# Patient Record
Sex: Female | Born: 1965 | Race: Black or African American | Hispanic: No | Marital: Married | Smoking: Never smoker
Health system: Southern US, Community
[De-identification: ages and names within clinical notes are randomized; demographics above are authoritative.]

## PROBLEM LIST (undated history)

## (undated) DIAGNOSIS — D649 Anemia, unspecified: Secondary | ICD-10-CM

## (undated) DIAGNOSIS — T7840XA Allergy, unspecified, initial encounter: Secondary | ICD-10-CM

## (undated) HISTORY — PX: TUBAL LIGATION: SHX77

## (undated) HISTORY — DX: Anemia, unspecified: D64.9

## (undated) HISTORY — DX: Allergy, unspecified, initial encounter: T78.40XA

## (undated) HISTORY — PX: ABDOMINAL HYSTERECTOMY: SHX81

---

## 2005-06-08 ENCOUNTER — Emergency Department (HOSPITAL_COMMUNITY): Admission: EM | Admit: 2005-06-08 | Discharge: 2005-06-09 | Payer: Self-pay | Admitting: Family Medicine

## 2006-11-08 ENCOUNTER — Emergency Department (HOSPITAL_COMMUNITY): Admission: EM | Admit: 2006-11-08 | Discharge: 2006-11-08 | Payer: Self-pay | Admitting: Family Medicine

## 2006-11-14 ENCOUNTER — Other Ambulatory Visit: Admission: RE | Admit: 2006-11-14 | Discharge: 2006-11-14 | Payer: Self-pay | Admitting: Family Medicine

## 2007-03-08 IMAGING — CT CT PELVIS W/ CM
3 of 5 series · 14 of 32 positions shown, 19 images · IV contrast (ORAL OMNI 350 & 100 ML OMNI 300)
Comparison: None.
COMPARISON: None.

CLINICAL DATA: Abdominal pain and right lower back pain.  
 ABDOMEN CT WITH CONTRAST ? 06/09/05:
TECHNIQUE: Multidetector CT imaging of the abdomen was performed following the standard protocol during bolus administration of intravenous contrast. 
 Contrast: 100 cc Omnipaque 300 IV.
TECHNIQUE: Multidetector CT imaging of the pelvis was performed following the standard protocol during bolus administration of intravenous contrast.

[Series 2: routine abdomen · axial · 0.70mm/px · z∈[-323,-108]mm · 4 of 73 slices shown, 9 images]
[im 15/73  soft-tissue]
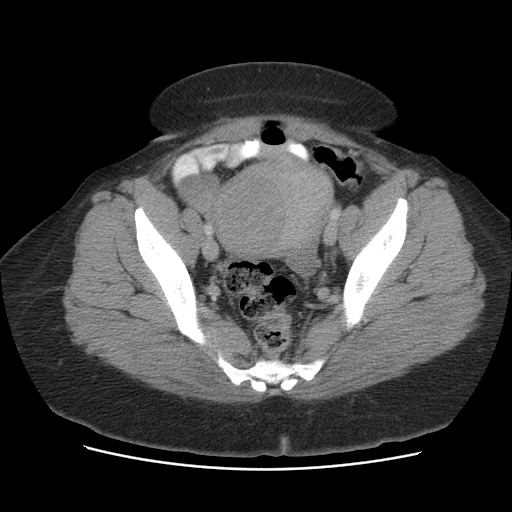
[im 15/73  lung]
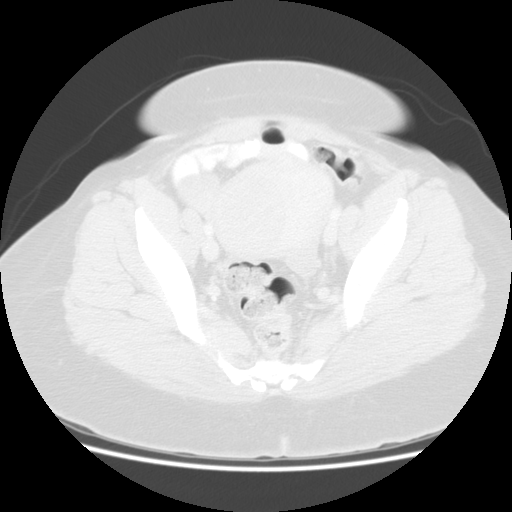
[im 15/73  bone]
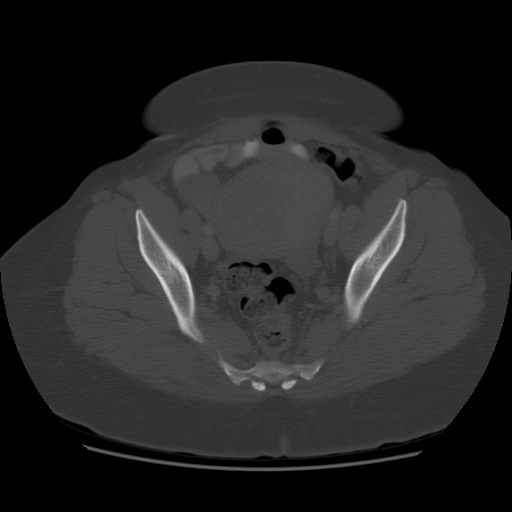
[im 29/73  soft-tissue]
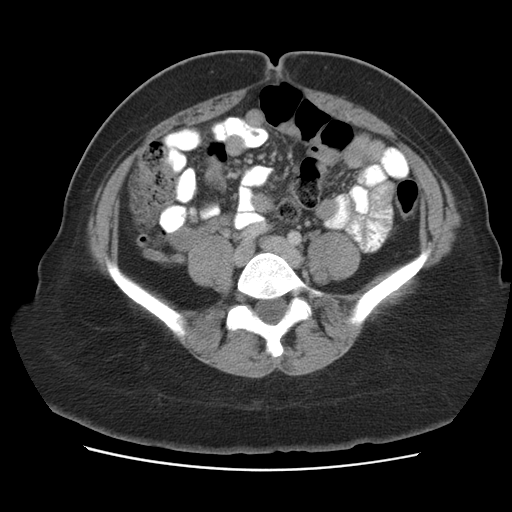
[im 29/73  lung]
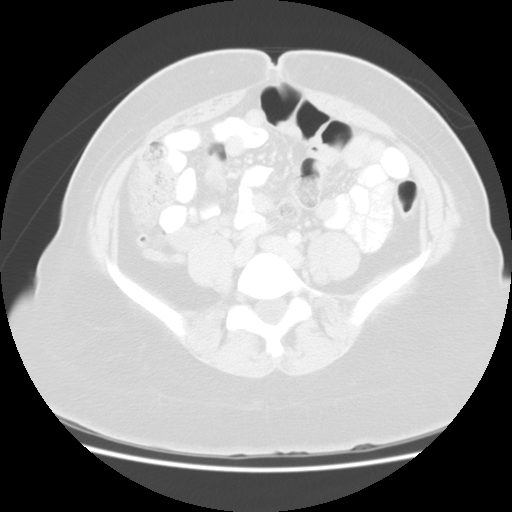
[im 44/73  soft-tissue]
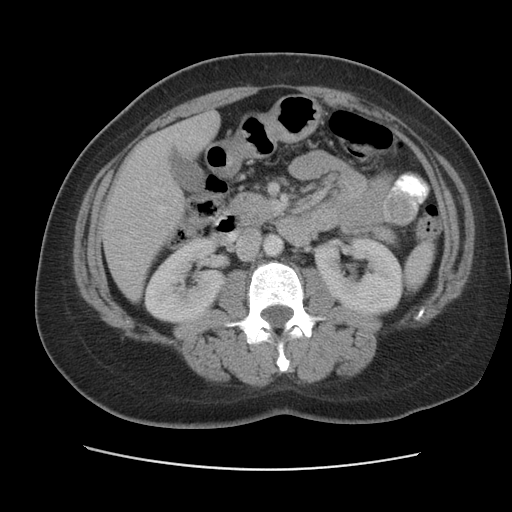
[im 44/73  lung]
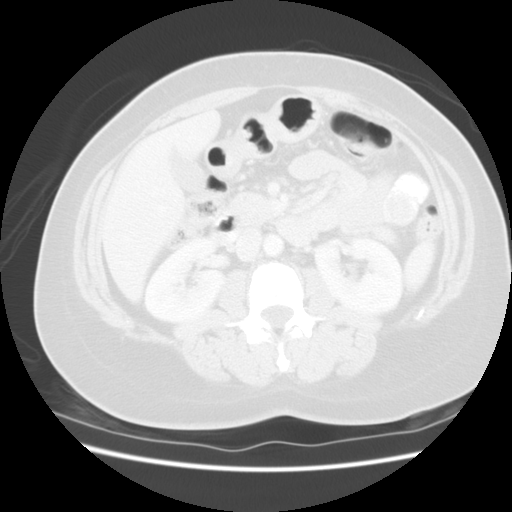
[im 58/73  soft-tissue]
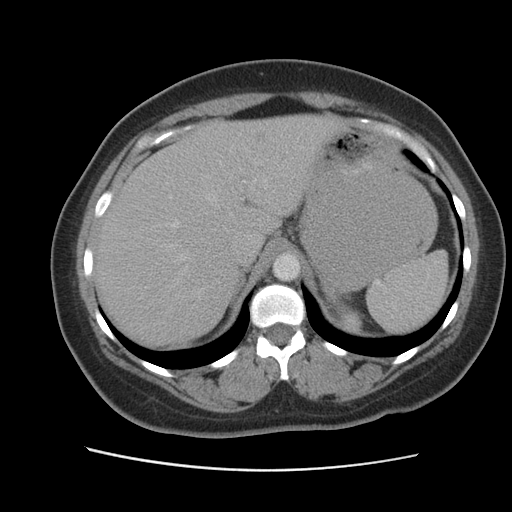
[im 58/73  lung]
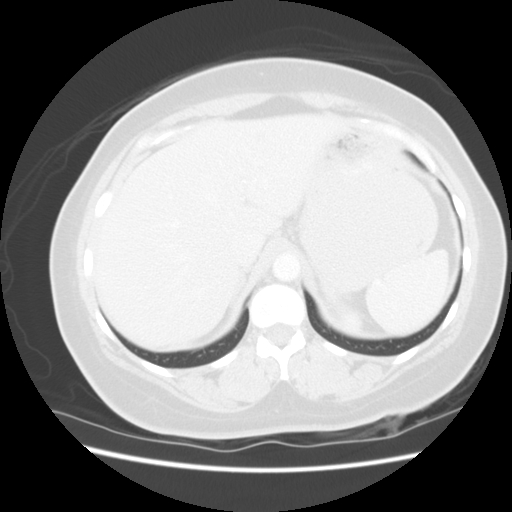

[Series 103: reformatted · sagittal · 0.82mm/px · 8 of 148 slices shown (1 of 2)]
[im 15/148  soft-tissue]
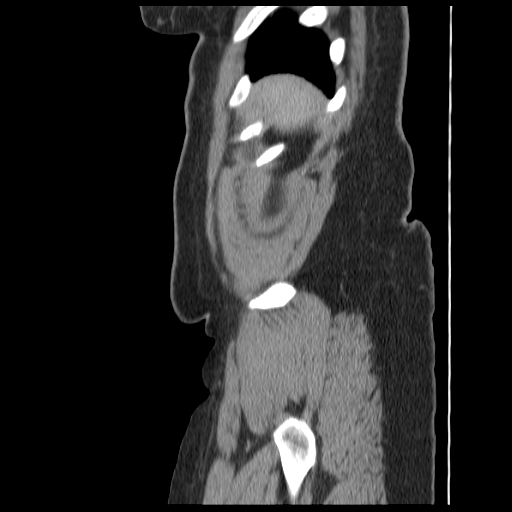
[im 30/148  soft-tissue]
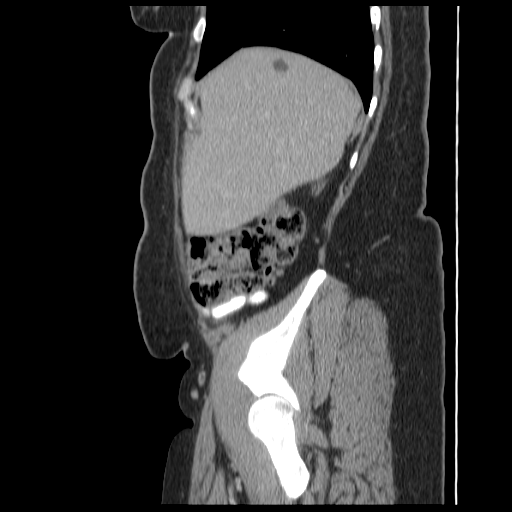
[im 45/148  soft-tissue]
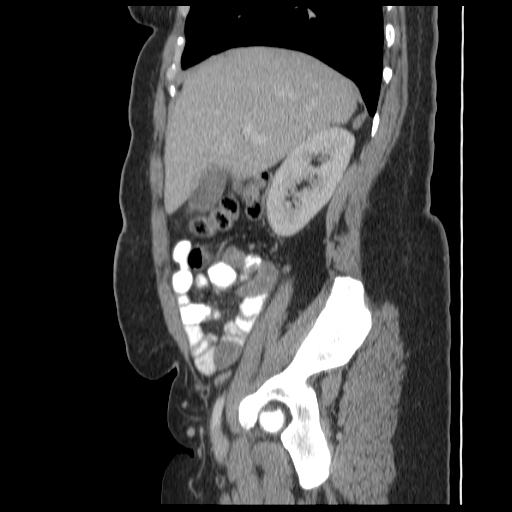
[im 59/148  soft-tissue]
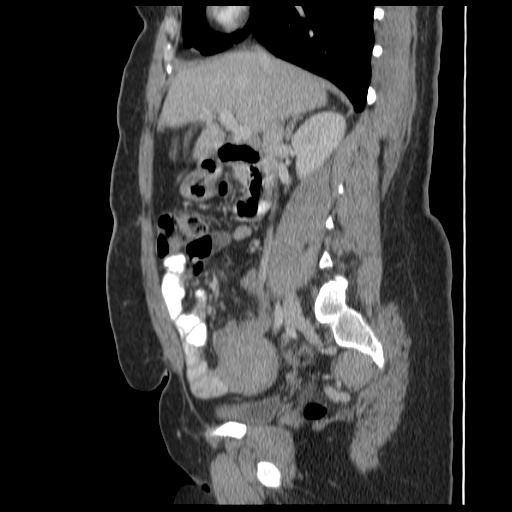
[im 89/148  soft-tissue]
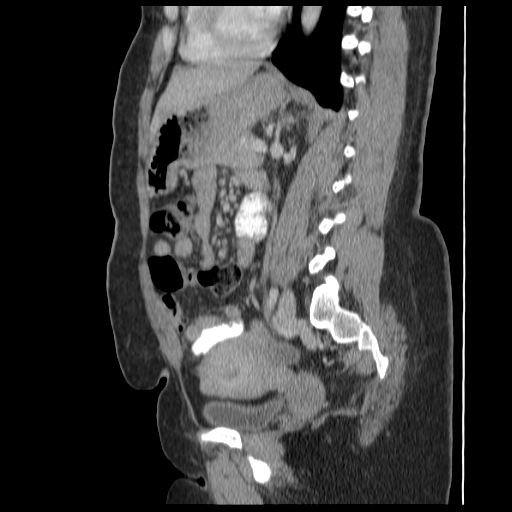
[im 103/148  soft-tissue]
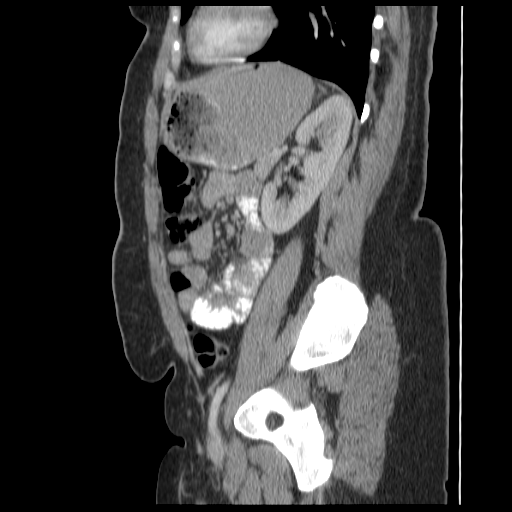
[im 118/148  soft-tissue]
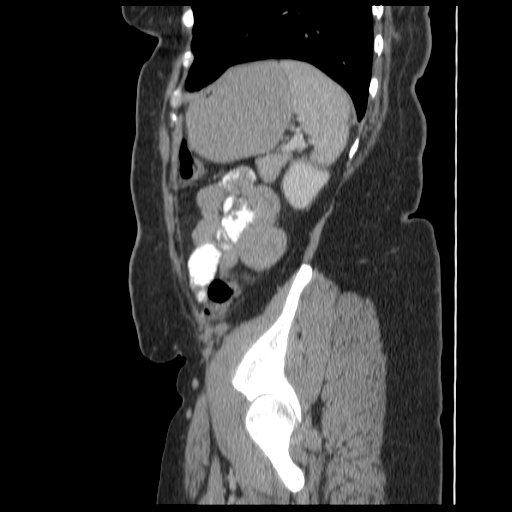
[im 133/148  soft-tissue]
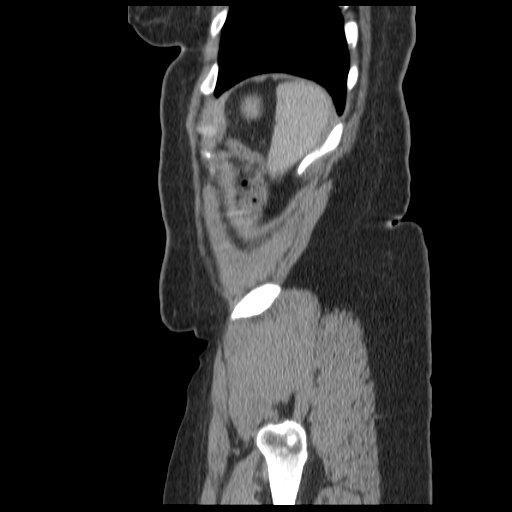

[Series 104: reformatted · coronal · 0.82mm/px · 2 of 128 slices shown (2 of 2)]
[im 15/128  soft-tissue]
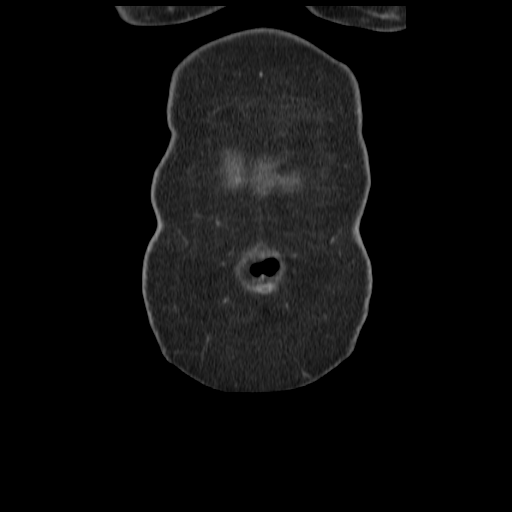
[im 29/128  soft-tissue]
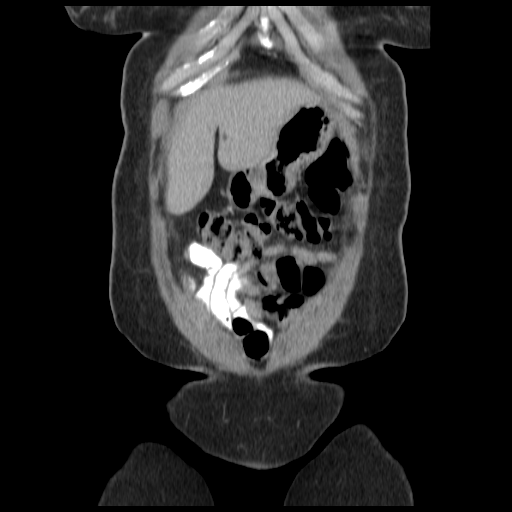

[14 of 32 positions shown; findings below may reference images not displayed]

FINDINGS: The lung bases are clear.  No pleural or pericardial effusion.  A low-attenuation lesion is seen in the dome of the liver compatible with a cyst.  A few too small to characterize low-attenuation lesions are seen in the right and left lobes of the liver also likely representing cysts.  The spleen, adrenal glands, kidneys, and pancreas all appear normal.  The stomach and small bowel have an unremarkable CT appearance.  There is no abdominal lymphadenopathy or fluid collection.  No focal bony abnormality.
IMPRESSION: No acute finding in the abdomen with hepatic cysts noted.
 PELVIS CT WITH CONTRAST ? 06/09/05:
FINDINGS: The appendix is well visualized and normal.  The patient has a large submucosal fibroid in the right aspect of the uterus measuring approximately 5.6 cm AP x 5.3 cm transverse by 5.2 cm craniocaudal.  The colon is unremarkable.  There is no free pelvic fluid or lymphadenopathy.  There is no focal bony abnormality.
IMPRESSION: 1.  Negative for appendicitis or other acute abnormality in the pelvis.
 2.  Large submucosal fibroid in the right side of the uterus.

## 2007-07-09 ENCOUNTER — Emergency Department (HOSPITAL_COMMUNITY): Admission: EM | Admit: 2007-07-09 | Discharge: 2007-07-09 | Payer: Self-pay | Admitting: Family Medicine

## 2007-11-23 ENCOUNTER — Other Ambulatory Visit: Admission: RE | Admit: 2007-11-23 | Discharge: 2007-11-23 | Payer: Self-pay | Admitting: Family Medicine

## 2007-11-26 ENCOUNTER — Encounter: Admission: RE | Admit: 2007-11-26 | Discharge: 2007-11-26 | Payer: Self-pay | Admitting: Family Medicine

## 2008-08-20 ENCOUNTER — Inpatient Hospital Stay (HOSPITAL_COMMUNITY): Admission: RE | Admit: 2008-08-20 | Discharge: 2008-08-22 | Payer: Self-pay | Admitting: Obstetrics and Gynecology

## 2008-08-20 ENCOUNTER — Encounter (INDEPENDENT_AMBULATORY_CARE_PROVIDER_SITE_OTHER): Payer: Self-pay | Admitting: Obstetrics and Gynecology

## 2008-10-29 ENCOUNTER — Emergency Department (HOSPITAL_COMMUNITY): Admission: EM | Admit: 2008-10-29 | Discharge: 2008-10-29 | Payer: Self-pay | Admitting: Emergency Medicine

## 2009-01-15 ENCOUNTER — Emergency Department (HOSPITAL_COMMUNITY): Admission: EM | Admit: 2009-01-15 | Discharge: 2009-01-15 | Payer: Self-pay | Admitting: Emergency Medicine

## 2010-05-30 LAB — CBC
HCT: 22.3 % — ABNORMAL LOW (ref 36.0–46.0)
MCV: 88.3 fL (ref 78.0–100.0)
RBC: 2.52 MIL/uL — ABNORMAL LOW (ref 3.87–5.11)
WBC: 13.1 10*3/uL — ABNORMAL HIGH (ref 4.0–10.5)

## 2010-05-31 LAB — CBC
Hemoglobin: 9.7 g/dL — ABNORMAL LOW (ref 12.0–15.0)
MCHC: 34 g/dL (ref 30.0–36.0)
MCV: 87.2 fL (ref 78.0–100.0)
RBC: 3.27 MIL/uL — ABNORMAL LOW (ref 3.87–5.11)
WBC: 6.7 10*3/uL (ref 4.0–10.5)

## 2010-05-31 LAB — PREGNANCY, URINE: Preg Test, Ur: NEGATIVE

## 2010-07-06 NOTE — H&P (Signed)
NAME:  Joan Campbell, Joan Campbell                ACCOUNT NO.:  [id number]   MEDICAL RECORD NO.:  [id number]          PATIENT TYPE:  AMB   LOCATION:  SDC                           [facility]ILITY:  [facility]   PHYSICIAN:  Hal Morales, M.D.DATE OF BIRTH:  10/19/1965   DATE OF ADMISSION:  DATE OF DISCHARGE:                              HISTORY & PHYSICAL   HISTORY OF PRESENT ILLNESS:  Joan Campbell is a 45 year old married African  American female, para 2-0-0-2, presenting for hysterectomy because of  symptomatic uterine fibroids, menorrhagia, pelvic pain, and anemia.  For  the past year, the patient reports that she has had an 8-day menstrual  flow, the first 2 days requiring her to change a tampon plus a maxi pad  every 30 minutes.  On occasion, the patient has blood to her clothes and  at other times she may go as long as 2 hours before she has to change  her tampon with the maxi pad.  Additionally, she will experience  cramping which she rates as the 10/10 on a 10-point pain scale and has  some relief (decreasing to 5/10 on 10-point pain scale) with ibuprofen  400 mg.  She denies any intermenstrual bleeding, dyspareunia, changes in  her bowel movements, urinary tract symptoms, or vaginitis symptoms.  A  pelvic ultrasound in October 2009 showed her uterus measuring 9.3 cm in  length and 9.1 cm in width.  She was also found to have multiple  fibroids measuring 5.6 x 6.0 x 6.9 cm, 4.3 x 3.7 x 3.6 cm, and at least  5 other fibroids with maximum diameter of 4.2 cm.  On this study, it was  described as being difficult to visualize endometrium because of the  fibroids.  Ovaries were normal size on this study.  The patient  underwent an endometrial biopsy which revealed no hyperplasia, atypia or  malignancy and her CSH was within normal limits.  The patient had a  hemoglobin done in April 2010, which returned 9.7 and subsequently the  patient was placed on twice a day iron therapy.  A review of options for  management were given to the patient to include Lupron oral  contraceptive, Mirena IUD, Depo-Provera, myomectomy, and hysterectomy.  However, the patient has decided to proceed with definitive therapy in  the form of hysterectomy with some consideration being given to a  supracervical hysterectomy.   PAST MEDICAL HISTORY:   OB HISTORY:  Gravida 2, para 2-0-0-2.  The patient had 2 cesarean  sections, 1 in 1989 and 1 in 1996, both yielding female children.   GYN HISTORY:  Menarche at 44 years old.  Last menstrual period on Jul 20, 2008.  The patient has had tubal ligation as a method of  contraception.  Denies any history of sexually transmitted diseases or  abnormal Pap smears.  Last normal Pap smear was October 2009.   MEDICAL HISTORY:  Positive for anemia, gastroesophageal reflux disease,  premenstrual anxiety, and a heart murmur.   SURGICAL HISTORY:  In 1996, bilateral tubal ligation.  Denies any  problems with anesthesia or history of blood  transfusions.   FAMILY HISTORY:  Cardiovascular disease, anemia, non-insulin-dependent  diabetes, and schizophrenia.   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   HABITS:  The patient rarely consumes alcohol.  She denies any tobacco or  illicit drug use.   CURRENT MEDICATIONS:  1. Ibuprofen 400 mg as needed.  2. Iron 1 tablet daily.   ALLERGIES:  The patient has no known drug allergies and also denies any  sensitivity to latex or shellfish.   REVIEW OF SYSTEMS:  The patient does wear glasses.  She admits to  occasional knee pain and occasional skin itchiness.  She denies any  chest pain, shortness of breath, headache, vision changes, ringing in  her ears, dizziness, muscle pain, nausea, vomiting, diarrhea, and except  as mentioned in the history of present illness.  The patient's review of  systems is otherwise negative.   PHYSICAL EXAMINATION:  VITAL SIGNS:  Blood  pressure 110/70 pulses 88,  temperature 99 degrees Fahrenheit orally, weight is 168, height 5 feet 5  inches tall.  Body mass index 28.  NECK:  Supple without masses.  There is no thyromegaly or cervical  adenopathy.  HEART:  Regular rate and rhythm.  LUNGS:  Clear.  BACK:  No CVA tenderness.  ABDOMEN:  No tenderness though there is a firm mass arising from the  pelvis to the level of the umbilicus.  There is no organomegaly.  EXTREMITIES:  No clubbing, cyanosis, or edema.  PELVIC:  EGBUS is within normal limits.  Vagina is normal.  Cervix is  nontender without lesions.  Uterus is 18-20 weeks size without  tenderness.  Adnexa without tenderness or masses.  Rectovaginal without  tenderness or masses.   IMPRESSION:  Symptomatic uterine fibroids, menorrhagia, anemia, and  pelvic pain.   DISPOSITION:  A discussion was held with the patient regarding the  indications for her procedure along with its risks which include but are  not limited to, reaction to anesthesia, damage to adjacent organs,  infection and excessive bleeding.  It was further reviewed with the  patient that should she desire to proceed with a supracervical  hysterectomy that her risks   may expand to include prolapse of that organ, monthly bleeding, cervical  cancer, and it was also shared with the patient that there is no clear  data indicating that sexual function is improved with the retention of  the cervix with the hysterectomy procedure.  The patient verbalized  understanding of these risk and wishes to proceed with supracervical  hysterectomy at [facility] [place] on August 20, 2008, at 11  o'clock a.m.  Marland Kitchen      Elmira J. Adline Peals.      Hal Morales, M.D.  Electronically Signed    EJP/MEDQ  D:  08/14/2008  T:  08/15/2008  Job:  [id number]

## 2010-07-06 NOTE — Op Note (Signed)
NAME:  Joan Campbell, Joan Campbell                ACCOUNT NO.:  [id number]   MEDICAL RECORD NO.:  [id number]          PATIENT TYPE:  INP   LOCATION:  9319                          FACILITY:  [facility]   PHYSICIAN:  Hal Morales, M.D.DATE OF BIRTH:  [birth year]   DATE OF PROCEDURE:  08/20/2008  DATE OF DISCHARGE:                               OPERATIVE REPORT   PREOPERATIVE DIAGNOSES:  Symptomatic uterine fibroids, menorrhagia,  anemia, and pelvic pain and desire for preservation of the cervix.   POSTOPERATIVE DIAGNOSES:  Symptomatic uterine fibroids, menorrhagia,  anemia, and pelvic pain and desire for preservation of the cervix.   OPERATION:  Abdominal supracervical hysterectomy and bilateral  salpingectomy.   SURGEON:  Vanessa P. Pennie Rushing, MD   FIRST ASSISTANT:  Elmira J. Lowell Guitar, PA   ANESTHESIA:  General orotracheal.   ESTIMATED BLOOD LOSS:  250 mL.   COMPLICATIONS:  None.   FINDINGS:  The uterus was approximately 14- to 16-week size   Dictation ended at this point.      Hal Morales, M.D.  Electronically Signed     VPH/MEDQ  D:  08/20/2008  T:  08/21/2008  Job:  [id number]

## 2010-07-06 NOTE — Op Note (Signed)
NAME:  Joan Campbell, Joan Campbell                ACCOUNT NO.:  [id number]   MEDICAL RECORD NO.:  [id number]          PATIENT TYPE:  INP   LOCATION:  9319                          [facility]   PHYSICIAN:  Hal Morales, M.D.DATE OF BIRTH:  September 14, 1965   DATE OF PROCEDURE:  08/20/2008  DATE OF DISCHARGE:                               OPERATIVE REPORT   PREOPERATIVE DIAGNOSES:  Symptomatic uterine fibroids, menorrhagia,  anemia, pelvic pain, desire for cervical preservation.   POSTOPERATIVE DIAGNOSES:  Symptomatic uterine fibroids, menorrhagia,  anemia, pelvic pain, desire for cervical preservation   OPERATION:  Abdominal supracervical hysterectomy and bilateral  salpingectomy.   SURGEON:  Hal Morales, MD   FIRST ASSISTANT:  Elmira J. Lowell Guitar, certified Advice worker.   ANESTHESIA:  General orotracheal.   ESTIMATED BLOOD LOSS:  250 mL.   COMPLICATIONS:  None.   FINDINGS:  The uterus was enlarged to 16- to 18-week size.  The tubes  were status post tubal interruption for sterilization.  The ovaries were  within normal limits.  There were significant adhesions to the anterior  uterine fundal fibroid from the peritoneum and from the bladder flap  status post cesarean section.   PROCEDURE IN DETAIL:  The patient was taken to the operating room after  appropriate identification and placed on the operating table.  After the  attainment of adequate general anesthesia with the patient in the supine  position, the abdomen, perineum and vagina were prepped with multiple  layers of Betadine and a Foley catheter was inserted into the bladder  and connected to straight drainage.  The abdomen was draped as a sterile  field.  The suprapubic region was infiltrated with 20 mL of 0.25%  Marcaine at the site of the previous cesarean section incision.  An  incision was made in this area and the abdomen opened in layers.  The  peritoneum was entered and the above-noted adhesions encountered.   A  combination of blunt and sharp dissection allowed lysis of adhesions  from the anterior abdominal wall to the uterus.  The uterus was grasped  at each cornual region with a Kelly clamp and elevated into the  operative field.  Additional lysis of adhesions allowed for the bladder  flap to be released from the anterior uterine fibroid and further  elevation was possible.  The left round ligament was then suture ligated  and incised and then incision taken anteriorly on the anterior leaf of  the broad ligament down to the site of the bladder flap on the left  side.  The utero-ovarian ligament and tubal ovarian junction were  clamped, cut, suture ligated and tied with a free tie.  A similar  procedure was carried out on the right side with the round ligament and  upper pedicle.  Further lysis of adhesions between the bladder and  uterine fundus was undertaken allowing the plane, as the bladder to the  dissected off the anterior uterus down to the level of the cervix.  The  uterine artery on the left side was then skeletonized, clamped, cut and  suture ligated.  A similar procedure was carried out with the uterine  artery on the right side.  The uterine fundus was then excised from the  cervix and removed from the operative field.  Copious irrigation was  carried out and hemostasis noted to be adequate.  The endocervical canal  was slightly cord and then cauterized to minimize the chance of  postoperative endometrial withdrawal bleeding.  The peritoneum was then  oversewn over the cervical stump.  The fallopian tube on the right side  was then identified and clamped, cut, and suture ligated with a suture  of 2-0 Vicryl with the remaining sutures of this procedure being 0  Vicryl.  The distal portion of tube was then excised from the ovary and  removed from the operative field.  A similar procedure was carried out  on the distal portion of tube on the left.  Copious irrigation was  carried  out and hemostasis noted to be adequate.  The abdominal  peritoneum was then closed with a running suture of 2-0 Vicryl.  The  rectus muscles were irrigated and made hemostatic with Bovie cautery.  The rectus fascia was closed with a running suture of 0 Vicryl from each  apex to the midline.  The subcutaneous tissue was irrigated and made  hemostatic with Bovie cautery.  A subcuticular suture of 3-0 Monocryl  was placed in the skin incision and a sterile dressing applied after  Steri-Strips have been applied.  The patient was awakened from general  anesthesia and taken to the recovery room in satisfactory condition  having tolerated the procedure well with sponge and instrument counts  correct.   SPECIMENS TO PATHOLOGY:  Uterine fundus and portions of right and left  fallopian tube.      Hal Morales, M.D.  Electronically Signed     VPH/MEDQ  D:  08/20/2008  T:  08/21/2008  Job:  [id number]

## 2010-07-09 NOTE — Discharge Summary (Signed)
NAME:  Joan Campbell, Joan Campbell                ACCOUNT NO.:  [id number]   MEDICAL RECORD NO.:  [id number]          PATIENT TYPE:  INP   LOCATION:  9319                          [facility]   PHYSICIAN:  Hal Morales, M.D.DATE OF BIRTH:  04-11-1965   DATE OF ADMISSION:  08/20/2008  DATE OF DISCHARGE:  08/22/2008                               DISCHARGE SUMMARY   DISCHARGE DIAGNOSES:  Symptomatic uterine fibroids, menorrhagia, anemia,  pelvic pain, and desire for preservation of cervix.   OPERATION:  On the date of admission, the patient underwent an abdominal  supracervical hysterectomy with bilateral salpingectomy, tolerating  procedure well.   HISTORY OF PRESENT ILLNESS:  Joan Campbell is a 45 year old married African  American female, para 2-0-0-2, presenting for hysterectomy because of  symptomatic uterine fibroids, menorrhagia, pelvic pain, and anemia.  Please see the patient's dictated history and physical examination for  details.   PREOPERATIVE PHYSICAL EXAMINATION:  VITAL SIGNS:  Blood pressure 110/70,  pulse 88, temperature 99 degrees Fahrenheit orally, weight was 160  pounds, height 5 feet 5 inches tall, body mass index was 28.  GENERAL:  Within normal limits.  PELVIC:  EG/BUS was within normal limits.  Vagina was normal.  Cervix  was nontender without lesions.  Uterus was 18-20 weeks' size without  tenderness.  Adnexa was without tenderness or masses.  Rectovaginal was  without tenderness or masses.   HOSPITAL COURSE:  On the date of admission, the patient underwent  aforementioned procedures, tolerating them all well.  Postoperative  course was unremarkable with the patient tolerating a postop hemoglobin  of 7.5 (preop hemoglobin 9.7).  By postop day #2, the patient had  resumed bowel and bladder function and was therefore deemed ready for  discharge home.   DISCHARGE MEDICATIONS:  The patient was directed to her home medication  reconciliation form.  She was further  advised to take Colace 100 mg  twice daily until her bowel movements are regular, ibuprofen 600 mg with  food every 6 hours for 5 days then as needed for pain, Vicodin 1-2  tablets every 4 hours as needed for pain, and was advised to increase  her iron to twice daily for 6 weeks.   FOLLOWUP:  The patient has a followup appointment with Dr. Pennie Rushing on  September 29, 2008, at 10:15 a.m.   DISCHARGE INSTRUCTIONS:  The patient was given a copy of [facility] postoperative instruction sheet.  She was further  advised to avoid driving for 2 weeks, heavy lifting for 4 weeks,  intercourse for 6 weeks, that she may shower and she may walk up steps.  The patient's diet was without restriction.   FINAL PATHOLOGY:  Uterus, fallopian tubes, supracervical hysterectomy,  and bilateral salpingectomy:  Endometrium - endometrial polyp;  proliferative-type endometrium; myometrium - leiomyomata, adenomyosis;  serosa - adhesions and fallopian tubes - essentially unremarkable.      Elmira J. Adline Peals.      Hal Morales, M.D.  Electronically Signed    EJP/MEDQ  D:  09/09/2008  T:  09/09/2008  Job:  [id number]

## 2014-07-01 ENCOUNTER — Ambulatory Visit (INDEPENDENT_AMBULATORY_CARE_PROVIDER_SITE_OTHER): Payer: 59 | Admitting: Physician Assistant

## 2014-07-01 VITALS — BP 110/66 | HR 87 | Temp 98.1°F | Resp 16 | Ht 65.0 in | Wt 200.6 lb

## 2014-07-01 DIAGNOSIS — W57XXXA Bitten or stung by nonvenomous insect and other nonvenomous arthropods, initial encounter: Secondary | ICD-10-CM | POA: Diagnosis not present

## 2014-07-01 DIAGNOSIS — R0981 Nasal congestion: Secondary | ICD-10-CM

## 2014-07-01 DIAGNOSIS — J302 Other seasonal allergic rhinitis: Secondary | ICD-10-CM

## 2014-07-01 DIAGNOSIS — S90562A Insect bite (nonvenomous), left ankle, initial encounter: Secondary | ICD-10-CM | POA: Diagnosis not present

## 2014-07-01 MED ORDER — LORATADINE 10 MG PO TABS: 10.0000 mg | ORAL_TABLET | Freq: Every day | ORAL | Status: DC

## 2014-07-01 MED ORDER — IPRATROPIUM BROMIDE 0.03 % NA SOLN: 2.0000 | Freq: Two times a day (BID) | NASAL | Status: DC

## 2014-07-01 MED ORDER — GUAIFENESIN ER 1200 MG PO TB12
1.0000 | ORAL_TABLET | Freq: Two times a day (BID) | ORAL | Status: DC | PRN
Start: 2014-07-01 — End: 2020-06-18

## 2014-07-01 MED ORDER — MUPIROCIN 2 % EX OINT: 1.0000 "application " | TOPICAL_OINTMENT | Freq: Two times a day (BID) | CUTANEOUS | Status: DC

## 2014-07-01 NOTE — Progress Notes (Signed)
[facility] [address], [place] [id number] [phone]  Date:  07/01/2014   Name:  Joan Campbell   DOB:  03-Nov-1965   MRN:  [id number]  PCP:  No primary care provider on file.    Chief Complaint: Nasal Congestion and Mass   History of Present Illness:  Joan Lundster M Decoursey is a 49 y.o. very pleasant female patient who presents with the following:  Mass started Saturday.  She was walking on wet grass.  She pushed it off, felt stinging.  Little pustule to start, and started today.  No fever .  About 99.  No numbness or tingling.  Does not hurt to ambulate.  Hurts to touch.  She has done nothing. Husband applied alcohol to the bite on Sunday.     Sunday with nasal congestion, sore throat that has resolved yesterday.  Sneezing and coughing non-productive.  No trouble with breathing.  No ear fullness or pain.  She has watery eyes.  She has allergies dxd with seasonal but no distinction.  She took zyrtec which helped some.    There are no active problems to display for this patient.   Past Medical History  Diagnosis Date  . Allergy   . Anemia     Past Surgical History  Procedure Laterality Date  . Cesarean section    . Abdominal hysterectomy    . Tubal ligation      History  Substance Use Topics  . Smoking status: Not on file  . Smokeless tobacco: Not on file  . Alcohol Use: Not on file    Family History  Problem Relation Age of Onset  . Cancer Mother   . Diabetes Mother   . Mental illness Sister   . Heart disease Maternal Grandmother     No Known Allergies  Medication list has been reviewed and updated.  No current outpatient prescriptions on file prior to visit.   No current facility-administered medications on file prior to visit.    Review of Systems: ROS otherwise unremarkable unless listed above.    Physical Examination: Filed Vitals:   07/01/14 1050  BP: 110/66  Pulse: 87  Temp: 98.1 F (36.7 C)  Resp: 16   Filed Vitals:    07/01/14 1050  Height: 5\' 5"  (1.651 m)  Weight: 200 lb 9.6 oz (90.992 kg)   Body mass index is 33.38 kg/(m^2). Ideal Body Weight: Weight in (lb) to have BMI = 25: 149.9  Physical Exam  Constitutional: She is oriented to person, place, and time. She appears well-developed and well-nourished. No distress.  HENT:  Head: Normocephalic and atraumatic.  Right Ear: Tympanic membrane, external ear and ear canal normal.  Left Ear: Tympanic membrane, external ear and ear canal normal.  Nose: Mucosal edema (Pale mucosa) and rhinorrhea present. Right sinus exhibits no maxillary sinus tenderness and no frontal sinus tenderness. Left sinus exhibits no maxillary sinus tenderness and no frontal sinus tenderness.  Mouth/Throat: Oropharynx is clear and moist. No oropharyngeal exudate.  Eyes: EOM are normal. Pupils are equal, round, and reactive to light. Right eye exhibits no discharge. Left eye exhibits no discharge.  Cardiovascular: Normal rate, regular rhythm and normal heart sounds.  Exam reveals no gallop and no friction rub.   No murmur heard. Pulmonary/Chest: Effort normal. No apnea. No respiratory distress. She has no decreased breath sounds. She has no wheezes. She has no rales.  Neurological: She is alert and oriented to person, place, and time. No cranial  nerve deficit. Coordination normal.  Skin: Skin is warm and dry. She is not diaphoretic.  Left medial malleolus with localized erythematous pustule.  Mildly tender, but normal ROM and strength at all planes.  Normal dp pulse  Psychiatric: She has a normal mood and affect. Her behavior is normal.     Assessment and Plan: 49 year old female is here today for chief complaint of nasal congestion and mass.  This appears to be allergic rhinitis.  I have advised that she start a second generation antihistamine, ipratropium nasal spray, and mucinex to thin mucus.  Insect bite, minorly infected.  Advised to apply mupirocin to the localized site,  though the body should be able to fight off infection.  Seasonal allergies - Plan: Guaifenesin (MUCINEX MAXIMUM STRENGTH) 1200 MG TB12, ipratropium (ATROVENT) 0.03 % nasal spray, loratadine (CLARITIN) 10 MG tablet  Nasal congestion - Plan: Guaifenesin (MUCINEX MAXIMUM STRENGTH) 1200 MG TB12, ipratropium (ATROVENT) 0.03 % nasal spray, loratadine (CLARITIN) 10 MG tablet  Insect bite of ankle, left, initial encounter - Plan: mupirocin ointment (BACTROBAN) 2 %  Trena PlattStephanie Kermit Arnette, PA-C [facility] 5/12/20169:31 AM

## 2014-07-01 NOTE — Patient Instructions (Addendum)
Please continue to hydrate well. Take the medication as prescribed. You may also want to try sudafed as a decongestant.

## 2014-07-03 ENCOUNTER — Encounter: Payer: Self-pay | Admitting: Physician Assistant

## 2019-04-28 ENCOUNTER — Ambulatory Visit: Payer: Self-pay | Attending: Internal Medicine

## 2019-04-28 DIAGNOSIS — Z23 Encounter for immunization: Secondary | ICD-10-CM | POA: Insufficient documentation

## 2019-04-28 NOTE — Progress Notes (Signed)
   Covid-19 Vaccination Clinic  Name:  Joan Campbell    MRN: [id number] DOB: 12-Mar-1965  04/28/2019  [name] was observed post Covid-19 immunization for 15 minutes without incident. She was provided with Vaccine Information Sheet and instruction to access the V-Safe system.   [name] was instructed to call 911 with any severe reactions post vaccine: Marland Kitchen Difficulty breathing  . Swelling of face and throat  . A fast heartbeat  . A bad rash all over body  . Dizziness and weakness   Immunizations Administered    Name Date Dose VIS Date Route   Pfizer COVID-19 Vaccine 04/28/2019  6:40 PM 0.3 mL 02/01/2019 Intramuscular   Manufacturer: [employer], [employer]   Lot: [id number]   NDC: [address]-9

## 2019-05-29 ENCOUNTER — Ambulatory Visit: Payer: Self-pay | Attending: Internal Medicine

## 2019-05-29 DIAGNOSIS — Z23 Encounter for immunization: Secondary | ICD-10-CM

## 2019-05-29 NOTE — Progress Notes (Signed)
   Covid-19 Vaccination Clinic  Name:  Joan Campbell    MRN: [id number] DOB: Jul 10, 1965  05/29/2019  [name] was observed post Covid-19 immunization for 15 minutes without incident. She was provided with Vaccine Information Sheet and instruction to access the V-Safe system.   [name] was instructed to call 911 with any severe reactions post vaccine: Marland Kitchen Difficulty breathing  . Swelling of face and throat  . A fast heartbeat  . A bad rash all over body  . Dizziness and weakness   Immunizations Administered    Name Date Dose VIS Date Route   Pfizer COVID-19 Vaccine 05/29/2019 10:04 AM 0.3 mL 02/01/2019 Intramuscular   Manufacturer: [employer], [employer]   Lot: [id number]   NDC: [address]-1

## 2020-02-13 ENCOUNTER — Ambulatory Visit: Payer: Self-pay | Attending: Internal Medicine

## 2020-02-13 DIAGNOSIS — Z23 Encounter for immunization: Secondary | ICD-10-CM

## 2020-02-13 NOTE — Progress Notes (Signed)
   Covid-19 Vaccination Clinic  Name:  Joan Campbell    MRN: [id number] DOB: 11-Dec-1965  02/13/2020  Joan Campbell was observed post Covid-19 immunization for 15 minutes without incident. She was provided with Vaccine Information Sheet and instruction to access the V-Safe system.   Joan Campbell was instructed to call 911 with any severe reactions post vaccine: Marland Kitchen Difficulty breathing  . Swelling of face and throat  . A fast heartbeat  . A bad rash all over body  . Dizziness and weakness   Immunizations Administered    Name Date Dose VIS Date Route   Pfizer COVID-19 Vaccine 02/13/2020  5:16 PM 0.3 mL 12/11/2019 Intramuscular   Manufacturer: [employer], [employer]   Lot: [id number]   NDC: [address]-2

## 2020-05-17 ENCOUNTER — Ambulatory Visit (HOSPITAL_COMMUNITY)
Admission: EM | Admit: 2020-05-17 | Discharge: 2020-05-17 | Disposition: A | Discharge status: Home / Self Care | Payer: BC Managed Care – PPO

## 2020-05-17 ENCOUNTER — Other Ambulatory Visit: Payer: Self-pay

## 2020-05-17 ENCOUNTER — Encounter (HOSPITAL_COMMUNITY): Payer: Self-pay

## 2020-05-17 DIAGNOSIS — H6980 Other specified disorders of Eustachian tube, unspecified ear: Secondary | ICD-10-CM

## 2020-05-17 DIAGNOSIS — H6983 Other specified disorders of Eustachian tube, bilateral: Secondary | ICD-10-CM | POA: Diagnosis not present

## 2020-05-17 DIAGNOSIS — J3089 Other allergic rhinitis: Secondary | ICD-10-CM

## 2020-05-17 DIAGNOSIS — H9312 Tinnitus, left ear: Secondary | ICD-10-CM | POA: Diagnosis not present

## 2020-05-17 MED ORDER — PREDNISONE 50 MG PO TABS: ORAL_TABLET | ORAL | 0 refills | Status: DC

## 2020-05-17 MED ORDER — FLUTICASONE PROPIONATE 50 MCG/ACT NA SUSP: 1.0000 | Freq: Two times a day (BID) | NASAL | 2 refills | Status: AC

## 2020-05-17 NOTE — ED Provider Notes (Signed)
[facility]    CSN: [id number] Arrival date & time: 05/17/20  1653      History   Chief Complaint Chief Complaint  Patient presents with  . Tinnitus    HPI Joan Campbell is a 55 y.o. female.   Patient presenting today with about 2 weeks of intermittent left ear tinnitus, pressure, popping, fullness sensation.  She denies fever, chills, drainage to the ear,, headache, sore throat.  She does endorse congestion typically worse at nighttime due to seasonal allergies despite taking Allegra daily.  No recent flying or swimming.  Has not tried anything over-the-counter for symptoms.     Past Medical History:  Diagnosis Date  . Allergy   . Anemia     There are no problems to display for this patient.   Past Surgical History:  Procedure Laterality Date  . ABDOMINAL HYSTERECTOMY    . CESAREAN SECTION    . TUBAL LIGATION      OB History   No obstetric history on file.      Home Medications    Prior to Admission medications   Medication Sig Start Date End Date Taking? Authorizing Provider  fexofenadine (ALLEGRA) 180 MG tablet Take 180 mg by mouth daily.   Yes [provider]  fluticasone (FLONASE) 50 MCG/ACT nasal spray Place 1 spray into both nostrils in the morning and at bedtime. 05/17/20  Yes Particia Nearing, PA-C  Multiple Vitamin (MULTIVITAMIN) tablet Take 1 tablet by mouth daily.   Yes [provider]  predniSONE (DELTASONE) 50 MG tablet Take 1 tab daily with breakfast 05/17/20  Yes Particia Nearing, PA-C  Guaifenesin [facility] MAXIMUM STRENGTH) 1200 MG TB12 Take 1 tablet (1,200 mg total) by mouth every 12 (twelve) hours as needed. 07/01/14   Trena Platt D, PA  ipratropium (ATROVENT) 0.03 % nasal spray Place 2 sprays into both nostrils 2 (two) times daily. 07/01/14   Trena Platt D, PA  loratadine (CLARITIN) 10 MG tablet Take 1 tablet (10 mg total) by mouth daily. 07/01/14   Trena Platt D, PA  mupirocin  ointment (BACTROBAN) 2 % Apply 1 application topically 2 (two) times daily. 07/01/14   Garnetta Buddy, PA    Family History Family History  Problem Relation Age of Onset  . Cancer Mother   . Diabetes Mother   . Mental illness Sister   . Heart disease Maternal Grandmother     Social History Social History   Tobacco Use  . Smoking status: Never Smoker  . Smokeless tobacco: Never Used  Substance Use Topics  . Alcohol use: Never    Alcohol/week: 0.0 standard drinks  . Drug use: Never     Allergies   Patient has no known allergies.   Review of Systems Review of Systems Per HPI Physical Exam Triage Vital Signs ED Triage Vitals  Enc Vitals Group     BP 05/17/20 1708 (!) 124/92     Pulse Rate 05/17/20 1708 86     Resp 05/17/20 1708 18     Temp 05/17/20 1708 98.7 F (37.1 C)     Temp Source 05/17/20 1708 Oral     SpO2 05/17/20 1708 99 %     Weight --      Height --      Head Circumference --      Peak Flow --      Pain Score 05/17/20 1706 0     Pain Loc --      Pain Edu? --  Excl. in GC? --    No data found.  Updated Vital Signs BP (!) 124/92 (BP Location: Right Arm)   Pulse 86   Temp 98.7 F (37.1 C) (Oral)   Resp 18   SpO2 99%   Visual Acuity Right Eye Distance:   Left Eye Distance:   Bilateral Distance:    Right Eye Near:   Left Eye Near:    Bilateral Near:     Physical Exam Vitals and nursing note reviewed.  Constitutional:      Appearance: Normal appearance. She is not ill-appearing.  HENT:     Head: Atraumatic.     Ears:     Comments: Bilateral middle ear effusion present    Nose: Rhinorrhea present.     Mouth/Throat:     Mouth: Mucous membranes are moist.     Pharynx: Oropharynx is clear. Posterior oropharyngeal erythema present. No oropharyngeal exudate.  Eyes:     Extraocular Movements: Extraocular movements intact.     Conjunctiva/sclera: Conjunctivae normal.  Cardiovascular:     Rate and Rhythm: Normal rate and regular  rhythm.     Heart sounds: Normal heart sounds.  Pulmonary:     Effort: Pulmonary effort is normal.     Breath sounds: Normal breath sounds.  Musculoskeletal:        General: Normal range of motion.     Cervical back: Normal range of motion and neck supple.  Skin:    General: Skin is warm and dry.  Neurological:     Mental Status: She is alert and oriented to person, place, and time.  Psychiatric:        Mood and Affect: Mood normal.        Thought Content: Thought content normal.        Judgment: Judgment normal.      UC Treatments / Results  Labs (all labs ordered are listed, but only abnormal results are displayed) Labs Reviewed - No data to display  EKG   Radiology No results found.  Procedures Procedures (including critical care time)  Medications Ordered in UC Medications - No data to display  Initial Impression / Assessment and Plan / UC Course  I have reviewed the triage vital signs and the nursing notes.  Pertinent labs & imaging results that were available during my care of the patient were reviewed by me and considered in my medical decision making (see chart for details).     Eustachian tube dysfunction, likely secondary to inadequate seasonal allergy control.  May increase her antihistamine to twice daily if needed and start Flonase twice daily.  She may also try some Sudafed as needed.  Will give a short prednisone burst initially to help resolve symptoms.  Follow-up with primary care for recheck.  Final Clinical Impressions(s) / UC Diagnoses   Final diagnoses:  Dysfunction of Eustachian tube, unspecified laterality  Seasonal allergic rhinitis due to other allergic trigger  Tinnitus of left ear   Discharge Instructions   None    ED Prescriptions    Medication Sig Dispense Auth. Provider   fluticasone (FLONASE) 50 MCG/ACT nasal spray Place 1 spray into both nostrils in the morning and at bedtime. 16 g Particia Nearing, [place]   predniSONE  (DELTASONE) 50 MG tablet Take 1 tab daily with breakfast 3 tablet Particia Nearing, [place]     PDMP not reviewed this encounter.   Particia Nearing, [place] 05/17/20 1756

## 2020-05-17 NOTE — ED Triage Notes (Signed)
Pt reports ringing in the eft ear and fullness sensation  x 2 weeks.

## 2020-06-18 ENCOUNTER — Other Ambulatory Visit (HOSPITAL_COMMUNITY)
Admission: RE | Admit: 2020-06-18 | Discharge: 2020-06-18 | Disposition: A | Discharge status: Home / Self Care | Payer: BC Managed Care – PPO | Source: Ambulatory Visit | Attending: Nurse Practitioner | Admitting: Nurse Practitioner

## 2020-06-18 ENCOUNTER — Other Ambulatory Visit: Payer: Self-pay

## 2020-06-18 ENCOUNTER — Ambulatory Visit (INDEPENDENT_AMBULATORY_CARE_PROVIDER_SITE_OTHER): Payer: BC Managed Care – PPO | Admitting: Nurse Practitioner

## 2020-06-18 ENCOUNTER — Encounter: Payer: Self-pay | Admitting: Nurse Practitioner

## 2020-06-18 VITALS — BP 128/70 | HR 72 | Temp 98.2°F | Ht 64.4 in | Wt 199.4 lb

## 2020-06-18 DIAGNOSIS — Z13228 Encounter for screening for other metabolic disorders: Secondary | ICD-10-CM

## 2020-06-18 DIAGNOSIS — Z124 Encounter for screening for malignant neoplasm of cervix: Secondary | ICD-10-CM

## 2020-06-18 DIAGNOSIS — Z1211 Encounter for screening for malignant neoplasm of colon: Secondary | ICD-10-CM

## 2020-06-18 DIAGNOSIS — E559 Vitamin D deficiency, unspecified: Secondary | ICD-10-CM | POA: Diagnosis not present

## 2020-06-18 DIAGNOSIS — H9312 Tinnitus, left ear: Secondary | ICD-10-CM

## 2020-06-18 DIAGNOSIS — Z6833 Body mass index (BMI) 33.0-33.9, adult: Secondary | ICD-10-CM

## 2020-06-18 DIAGNOSIS — Z7689 Persons encountering health services in other specified circumstances: Secondary | ICD-10-CM | POA: Diagnosis not present

## 2020-06-18 DIAGNOSIS — Z0001 Encounter for general adult medical examination with abnormal findings: Secondary | ICD-10-CM | POA: Diagnosis not present

## 2020-06-18 DIAGNOSIS — Z1231 Encounter for screening mammogram for malignant neoplasm of breast: Secondary | ICD-10-CM

## 2020-06-18 DIAGNOSIS — E6609 Other obesity due to excess calories: Secondary | ICD-10-CM | POA: Diagnosis not present

## 2020-06-18 NOTE — Patient Instructions (Signed)

## 2020-06-18 NOTE — Progress Notes (Signed)
I,Tianna Badgett,acting as a Education administrator for [employer], NP.,have documented all relevant documentation on the behalf of [employer], NP,as directed by  Bary Castilla, NP while in the presence of Bary Castilla, NP.  This visit occurred during the SARS-CoV-2 public health emergency.  Safety protocols were in place, including screening questions prior to the visit, additional usage of staff PPE, and extensive cleaning of exam room while observing appropriate contact time as indicated for disinfecting solutions.  Subjective:     Patient ID: Joan Campbell Reason , female    DOB: [birth year] , 55 y.o.   MRN: [id number]   Chief Complaint  Patient presents with  . Establish Care    HPI  Patient is here to establish care. She reports compliance with medications. She was previously seen in [place] at [facility] for her primary care but since she now works from home she would like to establish care closer to home. She was referred here by her husband who is also a patient.  Patient state that she should be up to date on her vaccines and will get her last PCP to fax over her records.   Diet: She states she needs to change her diet. She has been eating a lot.  Exercise: Walking and cycle class. Swim aerobic  Smoke: NO   Alcohol No  Hysterectomy: partial.     Past Medical History:  Diagnosis Date  . Allergy   . Anemia      Family History  Problem Relation Age of Onset  . Cancer Mother   . Diabetes Mother   . Ulcers Father   . Mental illness Sister   . Crohn's disease Brother   . Heart disease Maternal Grandmother   . Kidney disease Maternal Grandfather   . Heart disease Maternal Grandfather      Current Outpatient Medications:  .  fexofenadine (ALLEGRA) 180 MG tablet, Take 180 mg by mouth daily., Disp: , Rfl:  .  fluticasone (FLONASE) 50 MCG/ACT nasal spray, Place 1 spray into both nostrils in the morning and at bedtime., Disp: 16 g, Rfl: 2 .  Multiple Vitamin  (MULTIVITAMIN) tablet, Take 1 tablet by mouth daily., Disp: , Rfl:    No Known Allergies    The patient states she uses  for birth control. Last LMP was No LMP recorded. Patient has had a hysterectomy..  Negative for: breast discharge, breast lump(s), breast pain and breast self exam. Associated symptoms include abnormal vaginal bleeding. Pertinent negatives include abnormal bleeding (hematology), anxiety, decreased libido, depression, difficulty falling sleep, dyspareunia, history of infertility, nocturia, sexual dysfunction, sleep disturbances, urinary incontinence, urinary urgency, vaginal discharge and vaginal itching. Diet regular.The patient states her exercise level is    . The patient's tobacco use is:  Social History   Tobacco Use  Smoking Status Never Smoker  Smokeless Tobacco Never Used  . She has been exposed to passive smoke. The patient's alcohol use is:  Social History   Substance and Sexual Activity  Alcohol Use Never  . Alcohol/week: 0.0 standard drinks  . Additional information: Last pap 2022, next one scheduled for 2025  Review of Systems  Constitutional: Negative.  Negative for chills and fever.  HENT: Positive for ear pain. Negative for congestion, hearing loss, sinus pressure, sinus pain and sore throat.   Eyes: Negative.   Respiratory: Negative.  Negative for cough and shortness of breath.   Cardiovascular: Negative.  Negative for chest pain and palpitations.  Gastrointestinal: Negative for constipation, diarrhea, nausea and vomiting.  Endocrine: Negative.   Genitourinary: Negative.   Musculoskeletal: Negative.   Skin: Negative.   Allergic/Immunologic: Negative.   Neurological: Negative.  Negative for dizziness, weakness, numbness and headaches.  Hematological: Negative.   Psychiatric/Behavioral: Negative.      Today's Vitals   06/18/20 1023  BP: 128/70  Pulse: 72  Temp: 98.2 F (36.8 C)  TempSrc: Oral  Weight: 199 lb 6.4 oz (90.4 kg)  Height: 5'  4.4" (1.636 m)   Body mass index is 33.8 kg/m.  Wt Readings from Last 3 Encounters:  06/18/20 199 lb 6.4 oz (90.4 kg)  07/01/14 200 lb 9.6 oz (91 kg)    Objective:  Physical Exam Vitals and nursing note reviewed.  Constitutional:      Appearance: Normal appearance. She is obese.  HENT:     Head: Normocephalic and atraumatic.     Right Ear: Hearing, tympanic membrane, ear canal and external ear normal. No decreased hearing noted. No tenderness. Tympanic membrane is not bulging.     Left Ear: Hearing, tympanic membrane, ear canal and external ear normal. No decreased hearing noted. No tenderness. Tympanic membrane is not bulging.     Nose: Nose normal.     Mouth/Throat:     Mouth: Mucous membranes are moist.     Pharynx: Oropharynx is clear.  Eyes:     Extraocular Movements: Extraocular movements intact.     Conjunctiva/sclera: Conjunctivae normal.     Pupils: Pupils are equal, round, and reactive to light.  Cardiovascular:     Rate and Rhythm: Normal rate and regular rhythm.     Pulses: Normal pulses.     Heart sounds: Normal heart sounds.  Pulmonary:     Effort: Pulmonary effort is normal.     Breath sounds: Normal breath sounds.  Chest:  Breasts:     Tanner Score is 5.     Right: Normal. No swelling.     Left: Normal. No swelling.    Abdominal:     General: Abdomen is flat. Bowel sounds are normal.     Palpations: Abdomen is soft.  Genitourinary:    General: Normal vulva.     Vagina: No vaginal discharge.     Rectum: Normal.  Musculoskeletal:        General: Normal range of motion.     Cervical back: Normal range of motion and neck supple.  Skin:    General: Skin is warm and dry.     Capillary Refill: Capillary refill takes less than 2 seconds.  Neurological:     General: No focal deficit present.     Mental Status: She is alert and oriented to person, place, and time.  Psychiatric:        Mood and Affect: Mood normal.        Behavior: Behavior normal.          Assessment And Plan:     1. Encounter to establish care with new doctor --Patient is here for their annual physical exam and we discussed any changes to medication and medical history.  -Behavior modification was discussed as well as diet and exercise history  -Patient will continue to exercise regularly and modify their diet.  -Recommendation for yearly physical annuals, immunization and screenings including mammogram and colonoscopy were discussed with the patient.  -Recommended intake of multivitamin, vitamin D and calcium.  -Individualized advise was given to the patient pertaining to their own health history in regards to diet, exercise, medical condition and referrals.  - CBC -  CMP14+EGFR - Hepatitis C antibody - HIV Antibody (routine testing w rflx)  2. Ringing in ears, left -Patient does not have any hearing loss but will refer to ENT for further evaluation.  - Ambulatory referral to ENT  3. Vitamin D deficiency -Will check today and supplement as needed. Advised patient to get 15 min. Of sunlight daily.  - VITAMIN D 25 Hydroxy (Vit-D Deficiency, Fractures)  4. Screening for metabolic disorder -Will check for any metabolic disorder and assess and evaluate te with lab work.  - Hemoglobin A1c - Lipid panel - TSH + free T4  5. Screening mammogram, encounter for - MM Digital Screening; Future  6. Encounter for screening colonoscopy - Ambulatory referral to Gastroenterology  7. Encounter for screening for cervical cancer - Cytology -Pap Smear  8. Class 1 obesity due to excess calories without serious comorbidity with body mass index (BMI) of 33.0 to 33.9 in adult -Advised patient on a healthy diet including avoiding fast food and red meats. Increase the intake of lean meats including grilled chicken and [place].  Drink a lot of water. Decrease intake of fatty foods. Exercise for 30-45 min. 4-5 a week to decrease the risk of cardiac event.   Staying healthy and  adopting a healthy lifestyle for your overall health is important. You should eat 7 or more servings of fruits and vegetables per day. You should drink plenty of water to keep yourself hydrated and your kidneys healthy. This includes about 65-80+ fluid ounces of water. Limit your intake of animal fats especially for elevated cholesterol. Avoid highly processed food and limit your salt intake if you have hypertension. Avoid foods high in saturated/Trans fats. Along with a healthy diet it is also very important to maintain time for yourself to maintain a healthy mental health with low stress levels. You should get atleast 150 min of moderate intensity exercise weekly for a healthy heart. Along with eating right and exercising, aim for at least 7-9 hours of sleep daily.  Eat more whole grains which includes barley, wheat berries, oats, brown rice and whole wheat pasta. Use healthy plant oils which include olive, soy, corn, sunflower and peanut. Limit your caffeine and sugary drinks. Limit your intake of fast foods. Limit milk and dairy products to one or two daily servings.    Patient was given opportunity to ask questions. Patient verbalized understanding of the plan and was able to repeat key elements of the plan. All questions were answered to their satisfaction.   Bary Castilla, DNP   I, Debbe Mounts, DNP  have reviewed all documentation for this visit. The documentation on 06/18/2020  for the exam, diagnosis, procedures, and orders are all accurate and complete.    THE PATIENT IS ENCOURAGED TO PRACTICE SOCIAL DISTANCING DUE TO THE COVID-19 PANDEMIC.

## 2020-06-18 NOTE — Addendum Note (Signed)
Addended by: Delma Officer on: 06/18/2020 04:59 PM   Modules accepted: Level of Service

## 2020-06-19 ENCOUNTER — Other Ambulatory Visit: Payer: Self-pay | Admitting: Nurse Practitioner

## 2020-06-19 DIAGNOSIS — R87610 Atypical squamous cells of undetermined significance on cytologic smear of cervix (ASC-US): Secondary | ICD-10-CM | POA: Diagnosis not present

## 2020-06-19 DIAGNOSIS — E559 Vitamin D deficiency, unspecified: Secondary | ICD-10-CM

## 2020-06-19 LAB — LIPID PANEL
Chol/HDL Ratio: 2.2 ratio (ref 0.0–4.4)
Cholesterol, Total: 149 mg/dL (ref 100–199)
HDL: 67 mg/dL (ref >39)
LDL Chol Calc (NIH): 70 mg/dL (ref 0–99)
Triglycerides: 56 mg/dL (ref 0–149)
VLDL Cholesterol Cal: 12 mg/dL (ref 5–40)

## 2020-06-19 LAB — CMP14+EGFR
ALT: 20 IU/L (ref 0–32)
AST: 16 IU/L (ref 0–40)
Albumin/Globulin Ratio: 1.6 (ref 1.2–2.2)
Albumin: 4.5 g/dL (ref 3.8–4.9)
Alkaline Phosphatase: 77 IU/L (ref 44–121)
BUN/Creatinine Ratio: 18 (ref 9–23)
BUN: 14 mg/dL (ref 6–24)
Bilirubin Total: 0.6 mg/dL (ref 0.0–1.2)
CO2: 23 mmol/L (ref 20–29)
Calcium: 9.4 mg/dL (ref 8.7–10.2)
Chloride: 103 mmol/L (ref 96–106)
Creatinine, Ser: 0.78 mg/dL (ref 0.57–1.00)
Globulin, Total: 2.9 g/dL (ref 1.5–4.5)
Glucose: 83 mg/dL (ref 65–99)
Potassium: 4.4 mmol/L (ref 3.5–5.2)
Sodium: 140 mmol/L (ref 134–144)
Total Protein: 7.4 g/dL (ref 6.0–8.5)
eGFR: 90 mL/min/{1.73_m2} (ref >59)

## 2020-06-19 LAB — HEMOGLOBIN A1C
Est. average glucose Bld gHb Est-mCnc: 114 mg/dL
Hgb A1c MFr Bld: 5.6 % (ref 4.8–5.6)

## 2020-06-19 LAB — CBC
Hematocrit: 40.4 % (ref 34.0–46.6)
Hemoglobin: 13 g/dL (ref 11.1–15.9)
MCH: 30.6 pg (ref 26.6–33.0)
MCHC: 32.2 g/dL (ref 31.5–35.7)
MCV: 95 fL (ref 79–97)
Platelets: 299 10*3/uL (ref 150–450)
RBC: 4.25 x10E6/uL (ref 3.77–5.28)
RDW: 12.5 % (ref 11.7–15.4)
WBC: 6 10*3/uL (ref 3.4–10.8)

## 2020-06-19 LAB — HEPATITIS C ANTIBODY: Hep C Virus Ab: <0.1 s/co ratio (ref 0.0–0.9)

## 2020-06-19 LAB — VITAMIN D 25 HYDROXY (VIT D DEFICIENCY, FRACTURES): Vit D, 25-Hydroxy: 27.4 ng/mL — ABNORMAL LOW (ref 30.0–100.0)

## 2020-06-19 LAB — HIV ANTIBODY (ROUTINE TESTING W REFLEX): HIV Screen 4th Generation wRfx: NONREACTIVE

## 2020-06-19 LAB — TSH+FREE T4
Free T4: 1.1 ng/dL (ref 0.82–1.77)
TSH: 0.555 u[IU]/mL (ref 0.450–4.500)

## 2020-06-19 MED ORDER — VITAMIN D (ERGOCALCIFEROL) 1.25 MG (50000 UNIT) PO CAPS: 50000.0000 [IU] | ORAL_CAPSULE | ORAL | 0 refills | Status: DC

## 2020-06-25 ENCOUNTER — Other Ambulatory Visit: Payer: Self-pay | Admitting: Nurse Practitioner

## 2020-06-25 DIAGNOSIS — R87610 Atypical squamous cells of undetermined significance on cytologic smear of cervix (ASC-US): Secondary | ICD-10-CM

## 2020-06-25 LAB — CYTOLOGY - PAP
Comment: NEGATIVE
Diagnosis: UNDETERMINED — AB
High risk HPV: NEGATIVE

## 2020-07-02 DIAGNOSIS — Z1211 Encounter for screening for malignant neoplasm of colon: Secondary | ICD-10-CM | POA: Diagnosis not present

## 2020-07-02 DIAGNOSIS — E669 Obesity, unspecified: Secondary | ICD-10-CM | POA: Diagnosis not present

## 2020-07-13 DIAGNOSIS — R87610 Atypical squamous cells of undetermined significance on cytologic smear of cervix (ASC-US): Secondary | ICD-10-CM | POA: Diagnosis not present

## 2020-07-22 ENCOUNTER — Other Ambulatory Visit: Payer: Self-pay

## 2020-07-22 ENCOUNTER — Ambulatory Visit (INDEPENDENT_AMBULATORY_CARE_PROVIDER_SITE_OTHER): Payer: BC Managed Care – PPO | Admitting: Otolaryngology

## 2020-07-22 VITALS — Temp 97.7°F

## 2020-07-22 DIAGNOSIS — H9313 Tinnitus, bilateral: Secondary | ICD-10-CM | POA: Diagnosis not present

## 2020-07-22 NOTE — Progress Notes (Signed)
HPI: Joan Campbell is a 55 y.o. female who presents is referred by PCP for evaluation of tinnitus in both ears but worse on the left side.  She states that over the last 2 to 3 months she has had intermittent high-pitched tinnitus in her ears that comes and goes.  She has not noted any hearing problems.  She denies loud noise exposure although she has gone to some loud [employer]  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 She was told at one point that she has some fluid in the left ear and was prescribed Flonase which may have seemed to help a little bit..  Past Medical History:  Diagnosis Date  . Allergy   . Anemia    Past Surgical History:  Procedure Laterality Date  . ABDOMINAL HYSTERECTOMY    . CESAREAN SECTION    . TUBAL LIGATION     Social History   Socioeconomic History  . Marital status: Married    Spouse name: Not on file  . Number of children: Not on file  . Years of education: Not on file  . Highest education level: Not on file  Occupational History  . Not on file  Tobacco Use  . Smoking status: Never Smoker  . Smokeless tobacco: Never Used  Vaping Use  . Vaping Use: Never used  Substance and Sexual Activity  . Alcohol use: Never    Alcohol/week: 0.0 standard drinks  . Drug use: Never  . Sexual activity: Yes    Birth control/protection: Surgical  Other Topics Concern  . Not on file  Social History Narrative  . Not on file   Social Determinants of Health   Financial Resource Strain: Not on file  Food Insecurity: Not on file  Transportation Needs: Not on file  Physical Activity: Not on file  Stress: Not on file  Social Connections: Not on file   Family History  Problem Relation Age of Onset  . Cancer Mother   . Diabetes Mother   . Ulcers Father   . Mental illness Sister   . Crohn's disease Brother   . Heart disease Maternal Grandmother   . Kidney disease Maternal  Grandfather   . Heart disease Maternal Grandfather    No Known Allergies Prior to Admission medications   Medication Sig Start Date End Date Taking? Authorizing Provider  fexofenadine (ALLEGRA) 180 MG tablet Take 180 mg by mouth daily.    [provider]  fluticasone (FLONASE) 50 MCG/ACT nasal spray Place 1 spray into both nostrils in the morning and at bedtime. 05/17/20   Particia Nearing, PA-C  Multiple Vitamin (MULTIVITAMIN) tablet Take 1 tablet by mouth daily.    [provider]  Vitamin D, Ergocalciferol, (DRISDOL) 1.25 MG (50000 UNIT) CAPS capsule Take 1 capsule (50,000 Units total) by mouth every 7 (seven) days. 06/19/20   Ghumman, Ramandeep, NP     Positive ROS: Otherwise negative  All other systems have been reviewed and were otherwise negative with the exception of those mentioned in the HPI and as above.  Physical Exam: Constitutional: Alert, well-appearing, no acute distress Ears: External ears without lesions or tenderness. Ear canals are clear bilaterally with intact, clear TMs.  Auscultation of the ears revealed no objective tinnitus. Nasal: External nose without lesions. Septum with minimal deformity and moderate rhinitis.. Clear nasal passages with no signs of infection. Oral: Lips and gums without lesions. Tongue  and palate mucosa without lesions. Posterior oropharynx clear. Neck: No palpable adenopathy or masses Respiratory: Breathing comfortably  Skin: No facial/neck lesions or rash noted.  Audiogram demonstrated normal hearing in both ears with SRT's of 5 dB.  Tympanograms were type A bilaterally.  Procedures  Assessment: Tinnitus questionable etiology.  Plan: Reviewed with patient concerning limited treatment options for tinnitus.  I discussed with her concerning using masking noise when the tinnitus is bothersome. Since she has used Flonase in the past with some benefit I did discuss with her concerning regular use of Flonase which may  be beneficial. She will follow-up if she notices any worsening of the tinnitus or change in her hearing.   Narda Bonds, MD   CC:

## 2020-08-18 ENCOUNTER — Encounter: Payer: Self-pay | Admitting: Nurse Practitioner

## 2020-08-18 ENCOUNTER — Other Ambulatory Visit: Payer: Self-pay

## 2020-08-18 ENCOUNTER — Telehealth (INDEPENDENT_AMBULATORY_CARE_PROVIDER_SITE_OTHER): Payer: BC Managed Care – PPO | Admitting: Nurse Practitioner

## 2020-08-18 DIAGNOSIS — U071 COVID-19: Secondary | ICD-10-CM | POA: Diagnosis not present

## 2020-08-18 DIAGNOSIS — R0981 Nasal congestion: Secondary | ICD-10-CM | POA: Diagnosis not present

## 2020-08-18 DIAGNOSIS — R059 Cough, unspecified: Secondary | ICD-10-CM

## 2020-08-18 MED ORDER — GUAIFENESIN-DM 100-10 MG/5ML PO SYRP: 5.0000 mL | ORAL_SOLUTION | ORAL | 0 refills | Status: DC | PRN

## 2020-08-18 MED ORDER — AMOXICILLIN-POT CLAVULANATE 875-125 MG PO TABS: 1.0000 | ORAL_TABLET | Freq: Two times a day (BID) | ORAL | 0 refills | Status: AC

## 2020-08-18 MED ORDER — BENZONATATE 100 MG PO CAPS: 100.0000 mg | ORAL_CAPSULE | Freq: Three times a day (TID) | ORAL | 0 refills | Status: AC | PRN

## 2020-08-18 NOTE — Patient Instructions (Signed)
10 Things You Can Do to Manage Your COVID-19 Symptoms at Home If you have possible or confirmed COVID-19 Stay home except to get medical care. Monitor your symptoms carefully. If your symptoms get worse, call your healthcare provider immediately. Get rest and stay hydrated. If you have a medical appointment, call the healthcare provider ahead of time and tell them that you have or may have COVID-19. For medical emergencies, call 911 and notify the dispatch personnel that you have or may have COVID-19. Cover your cough and sneezes with a tissue or use the inside of your elbow. Wash your hands often with soap and water for at least 20 seconds or clean your hands with an alcohol-based hand sanitizer that contains at least 60% alcohol. As much as possible, stay in a specific room and away from other people in your home. Also, you should use a separate bathroom, if available. If you need to be around other people in or outside of the home, wear a mask. Avoid sharing personal items with other people in your household, like dishes, towels, and bedding. Clean all surfaces that are touched often, like counters, tabletops, and doorknobs. Use household cleaning sprays or wipes according to the label instructions. cdc.gov/coronavirus 09/06/2019 This information is not intended to replace advice given to you by your health care provider. Make sure you discuss any questions you have with your healthcare provider. Document Revised: 03/27/2020 Document Reviewed: 03/27/2020 Elsevier Patient Education  2022 [employer].  

## 2020-08-18 NOTE — Progress Notes (Signed)
Virtual Visit via 100% video visit.    This visit type was conducted due to national recommendations for restrictions regarding the COVID-19 Pandemic (e.g. social distancing) in an effort to limit this patient's exposure and mitigate transmission in our community.  Due to her co-morbid illnesses, this patient is at least at moderate risk for complications without adequate follow up.  This format is felt to be most appropriate for this patient at this time.  All issues noted in this document were discussed and addressed.  A limited physical exam was performed with this format.    This visit type was conducted due to national recommendations for restrictions regarding the COVID-19 Pandemic (e.g. social distancing) in an effort to limit this patient's exposure and mitigate transmission in our community.  Patients identity confirmed using two different identifiers.  This format is felt to be most appropriate for this patient at this time.  All issues noted in this document were discussed and addressed.  No physical exam was performed (except for noted visual exam findings with Video Visits).    Date:  08/18/2020   ID:  Joan Campbell, DOB 1965-06-28, MRN [id number]  Patient Location: Home   Provider location:   Office    Chief Complaint: Tested positive for covid   History of Present Illness:    Joan Campbell is a 55 y.o. female who presents via video conferencing for a telehealth visit today.    The patient does have symptoms concerning for COVID-19 infection (fever, chills, cough, or new shortness of breath).   She tested positive on Saturday and she started coughing on Friday. She is concerned that she has a really loud cough. She does have a fever now. Saturday it was 100.4. She has been taking tylenol. She did not take it today. She get "winded" easily but other than that no shortness of breath or chest pain. She is vaccinated and a booster in Dec. She has some sinus congestion. She is  stating that she is getting better.     Past Medical History:  Diagnosis Date   Allergy    Anemia    Past Surgical History:  Procedure Laterality Date   ABDOMINAL HYSTERECTOMY     CESAREAN SECTION     TUBAL LIGATION       Current Meds  Medication Sig   amoxicillin-clavulanate (AUGMENTIN) 875-125 MG tablet Take 1 tablet by mouth 2 (two) times daily for 7 days.   benzonatate (TESSALON PERLES) 100 MG capsule Take 1 capsule (100 mg total) by mouth 3 (three) times daily as needed for cough.   fexofenadine (ALLEGRA) 180 MG tablet Take 180 mg by mouth daily.   fluticasone (FLONASE) 50 MCG/ACT nasal spray Place 1 spray into both nostrils in the morning and at bedtime.   guaiFENesin-dextromethorphan (ROBITUSSIN DM) 100-10 MG/5ML syrup Take 5 mLs by mouth every 4 (four) hours as needed for cough.   Multiple Vitamin (MULTIVITAMIN) tablet Take 1 tablet by mouth daily.   Vitamin D, Ergocalciferol, (DRISDOL) 1.25 MG (50000 UNIT) CAPS capsule Take 1 capsule (50,000 Units total) by mouth every 7 (seven) days.     Allergies:   Patient has no known allergies.   Social History   Tobacco Use   Smoking status: Never   Smokeless tobacco: Never  Vaping Use   Vaping Use: Never used  Substance Use Topics   Alcohol use: Never    Alcohol/week: 0.0 standard drinks   Drug use: Never     Family Hx:  The patient's family history includes Cancer in her mother; Crohn's disease in her brother; Diabetes in her mother; Heart disease in her maternal grandfather and maternal grandmother; Kidney disease in her maternal grandfather; Mental illness in her sister; Ulcers in her father.  ROS:   Please see the history of present illness.    Review of Systems  Constitutional:  Positive for malaise/fatigue. Negative for chills and fever.  Respiratory:  Positive for cough and sputum production. Negative for shortness of breath and wheezing.   Cardiovascular:  Negative for chest pain.  Gastrointestinal:  Negative  for constipation and diarrhea.  Musculoskeletal:  Positive for myalgias. Negative for joint pain.  Skin:  Negative for rash.  Neurological:  Positive for weakness. Negative for dizziness.   All other systems reviewed and are negative.   Labs/Other Tests and Data Reviewed:    Recent Labs: 06/18/2020: ALT 20; BUN 14; Creatinine, Ser 0.78; Hemoglobin 13.0; Platelets 299; Potassium 4.4; Sodium 140; TSH 0.555   Recent Lipid Panel Lab Results  Component Value Date/Time   CHOL 149 06/18/2020 11:31 AM   TRIG 56 06/18/2020 11:31 AM   HDL 67 06/18/2020 11:31 AM   CHOLHDL 2.2 06/18/2020 11:31 AM   LDLCALC 70 06/18/2020 11:31 AM    Wt Readings from Last 3 Encounters:  06/18/20 199 lb 6.4 oz (90.4 kg)  07/01/14 200 lb 9.6 oz (91 kg)     Exam:    Vital Signs:  There were no vitals taken for this visit.    Physical Exam Vitals and nursing note reviewed.  HENT:     Head: Normocephalic and atraumatic.  Pulmonary:     Effort: Pulmonary effort is normal.  Neurological:     Mental Status: She is alert and oriented to person, place, and time.  Psychiatric:        Mood and Affect: Affect normal.    ASSESSMENT & PLAN:    1. COVID -Tested positive on sat.  -Slight fever on Sat. No fever today. Bodyaches, No SOB or chest pain.  -Patient has no prior comorbidities. Stating she is starting to feel better.  -Advised patient if her symptoms get any worse or if she experience any shortness of breath, chest pain or leg pain to go to the nearest urgent care or ED.   2. Cough - benzonatate (TESSALON PERLES) 100 MG capsule; Take 1 capsule (100 mg total) by mouth 3 (three) times daily as needed for cough.  Dispense: 30 capsule; Refill: 0 - guaiFENesin-dextromethorphan (ROBITUSSIN DM) 100-10 MG/5ML syrup; Take 5 mLs by mouth every 4 (four) hours as needed for cough.  Dispense: 118 mL; Refill: 0  3. Sinus congestion - amoxicillin-clavulanate (AUGMENTIN) 875-125 MG tablet; Take 1 tablet by mouth 2  (two) times daily for 7 days.  Dispense: 14 tablet; Refill: 0  The patient was encouraged to call or send a message through MyChart for any questions or concerns.   Follow up: if symptoms persist or do not get better.   Side effects and appropriate use of all the medication(s) were discussed with the patient today. Patient advised to use the medication(s) as directed by their healthcare provider. The patient was encouraged to read, review, and understand all associated package inserts and contact our office with any questions or concerns. The patient accepts the risks of the treatment plan and had an opportunity to ask questions.   Advised patient to take Vitamin C, D, Zinc. Keep yourself hydrated with a lot of water and rest. Take Delsym  for cough and Mucinex. Take Tylenol or pain reliever every 4-6 hours as needed for pain/fever/body ache. Educated patient if symptoms get worse or if she experiences any SOB or pain in her legs to seek immediate emergency care. Continue to monitor your pulse oxygen. Call us if you have any questions. Quarantine for 5 days if tested positive and no symptoms or 10 days if tested positive and have symptoms. Wear a mask around other people.      COVID-19 Education: The signs and symptoms of COVID-19 were discussed with the patient and how to seek care for testing (follow up with PCP or arrange E-visit).  The importance of social distancing was discussed today.  Patient Risk:   After full review of this patients clinical status, I feel that they are at least moderate risk at this time.  Time:   Today, I have spent 15 minutes/ seconds with the patient with telehealth technology discussing above diagnoses.     Medication Adjustments/Labs and Tests Ordered: Current medicines are reviewed at length with the patient today.  Concerns regarding medicines are outlined above.   Tests Ordered: No orders of the defined types were placed in this encounter.   Medication  Changes: Meds ordered this encounter  Medications   amoxicillin-clavulanate (AUGMENTIN) 875-125 MG tablet    Sig: Take 1 tablet by mouth 2 (two) times daily for 7 days.    Dispense:  14 tablet    Refill:  0   benzonatate (TESSALON PERLES) 100 MG capsule    Sig: Take 1 capsule (100 mg total) by mouth 3 (three) times daily as needed for cough.    Dispense:  30 capsule    Refill:  0   guaiFENesin-dextromethorphan (ROBITUSSIN DM) 100-10 MG/5ML syrup    Sig: Take 5 mLs by mouth every 4 (four) hours as needed for cough.    Dispense:  118 mL    Refill:  0    Disposition:  Follow up prn  Signed, Charlesetta Ivory, NP

## 2020-09-12 DIAGNOSIS — Z03818 Encounter for observation for suspected exposure to other biological agents ruled out: Secondary | ICD-10-CM | POA: Diagnosis not present

## 2020-10-30 DIAGNOSIS — K5730 Diverticulosis of large intestine without perforation or abscess without bleeding: Secondary | ICD-10-CM | POA: Diagnosis not present

## 2020-10-30 DIAGNOSIS — Z1211 Encounter for screening for malignant neoplasm of colon: Secondary | ICD-10-CM | POA: Diagnosis not present

## 2020-11-01 ENCOUNTER — Other Ambulatory Visit: Payer: Self-pay

## 2020-11-01 ENCOUNTER — Encounter (HOSPITAL_BASED_OUTPATIENT_CLINIC_OR_DEPARTMENT_OTHER): Payer: Self-pay | Admitting: *Deleted

## 2020-11-01 ENCOUNTER — Emergency Department (HOSPITAL_BASED_OUTPATIENT_CLINIC_OR_DEPARTMENT_OTHER)
Admission: EM | Admit: 2020-11-01 | Discharge: 2020-11-01 | Disposition: A | Discharge status: Home / Self Care | Payer: BC Managed Care – PPO | Attending: Emergency Medicine | Admitting: Emergency Medicine

## 2020-11-01 DIAGNOSIS — W57XXXA Bitten or stung by nonvenomous insect and other nonvenomous arthropods, initial encounter: Secondary | ICD-10-CM | POA: Insufficient documentation

## 2020-11-01 DIAGNOSIS — S8992XA Unspecified injury of left lower leg, initial encounter: Secondary | ICD-10-CM | POA: Diagnosis not present

## 2020-11-01 DIAGNOSIS — S80262A Insect bite (nonvenomous), left knee, initial encounter: Secondary | ICD-10-CM

## 2020-11-01 MED ORDER — DOXYCYCLINE HYCLATE 100 MG PO CAPS: 100.0000 mg | ORAL_CAPSULE | Freq: Two times a day (BID) | ORAL | 0 refills | Status: AC

## 2020-11-01 NOTE — ED Triage Notes (Signed)
Tuesday noticed a scab on left knee, noticed aree, now painful, red and puffy.

## 2020-11-01 NOTE — ED Notes (Signed)
Discharge instructions discussed with pt. Pt verbalized understanding. Pt stable and ambulatory. No signature pad available. 

## 2020-11-01 NOTE — ED Provider Notes (Addendum)
[facility] EMERGENCY DEPT Provider Note   CSN: [id number] Arrival date & time: 11/01/20  1227     History Chief Complaint  Patient presents with   Insect Bite    Joan Campbell is a 55 y.o. female.  HPI Patient presents for a tick bite.  5 days ago, she was in the shower after a swim.  She felt a palpable tick on the lateral aspect of her anterior left knee.  She pulled the tick out and noticed it was still moving.  She stepped on it and washed down the drain.  She is unsure if the head was removed with the body.  Over the next several days, she developed a small area of redness and itchiness around the site.  Area has not been draining.  It has not been painful.  It did seem swollen to her yesterday but she states that swelling has diminished since yesterday.  She has not had any systemic symptoms.  She has not developed any rashes.    Past Medical History:  Diagnosis Date   Allergy    Anemia     There are no problems to display for this patient.   Past Surgical History:  Procedure Laterality Date   ABDOMINAL HYSTERECTOMY     CESAREAN SECTION     TUBAL LIGATION       OB History   No obstetric history on file.     Family History  Problem Relation Age of Onset   Cancer Mother    Diabetes Mother    Ulcers Father    Mental illness Sister    Crohn's disease Brother    Heart disease Maternal Grandmother    Kidney disease Maternal Grandfather    Heart disease Maternal Grandfather     Social History   Tobacco Use   Smoking status: Never   Smokeless tobacco: Never  Vaping Use   Vaping Use: Never used  Substance Use Topics   Alcohol use: Never    Alcohol/week: 0.0 standard drinks   Drug use: Never    Home Medications Prior to Admission medications   Medication Sig Start Date End Date Taking? Authorizing Provider  doxycycline (VIBRAMYCIN) 100 MG capsule Take 1 capsule (100 mg total) by mouth 2 (two) times daily for 7 days. 11/01/20 11/08/20 Yes  Gloris Manchester, MD  benzonatate (TESSALON PERLES) 100 MG capsule Take 1 capsule (100 mg total) by mouth 3 (three) times daily as needed for cough. 08/18/20 08/18/21  Charlesetta Ivory, NP  fexofenadine (ALLEGRA) 180 MG tablet Take 180 mg by mouth daily.    [provider]  fluticasone (FLONASE) 50 MCG/ACT nasal spray Place 1 spray into both nostrils in the morning and at bedtime. 05/17/20   Particia Nearing, PA-C  guaiFENesin-dextromethorphan (ROBITUSSIN DM) 100-10 MG/5ML syrup Take 5 mLs by mouth every 4 (four) hours as needed for cough. 08/18/20   Charlesetta Ivory, NP  Multiple Vitamin (MULTIVITAMIN) tablet Take 1 tablet by mouth daily.    [provider]  Vitamin D, Ergocalciferol, (DRISDOL) 1.25 MG (50000 UNIT) CAPS capsule Take 1 capsule (50,000 Units total) by mouth every 7 (seven) days. 06/19/20   Charlesetta Ivory, NP    Allergies    Patient has no known allergies.  Review of Systems   Review of Systems  Constitutional:  Negative for activity change, appetite change, chills, fatigue and fever.  HENT:  Negative for ear pain and sore throat.   Eyes:  Negative for pain and visual disturbance.  Respiratory:  Negative for cough and shortness of breath.   Cardiovascular:  Negative for chest pain and palpitations.  Gastrointestinal:  Negative for abdominal pain, diarrhea, nausea and vomiting.  Genitourinary:  Negative for dysuria and hematuria.  Musculoskeletal:  Negative for arthralgias, back pain, gait problem, joint swelling, myalgias and neck pain.  Skin:  Negative for color change and rash.  Neurological:  Negative for dizziness, seizures, syncope, light-headedness, numbness and headaches.  All other systems reviewed and are negative.  Physical Exam Updated Vital Signs BP 128/87   Pulse 72   Temp 98.2 F (36.8 C) (Oral)   Resp 18   Ht 5\' 5"  (1.651 m)   Wt 83 kg   SpO2 100%   BMI 30.45 kg/m   Physical Exam Vitals and nursing note reviewed.   Constitutional:      General: She is not in acute distress.    Appearance: Normal appearance. She is well-developed. She is not ill-appearing, toxic-appearing or diaphoretic.  HENT:     Head: Normocephalic and atraumatic.     Right Ear: External ear normal.     Left Ear: External ear normal.     Nose: Nose normal.  Eyes:     Conjunctiva/sclera: Conjunctivae normal.  Cardiovascular:     Rate and Rhythm: Normal rate and regular rhythm.     Heart sounds: No murmur heard. Pulmonary:     Effort: Pulmonary effort is normal. No respiratory distress.     Breath sounds: Normal breath sounds.  Abdominal:     Palpations: Abdomen is soft.     Tenderness: There is no abdominal tenderness.  Musculoskeletal:        General: No tenderness.     Cervical back: Normal range of motion and neck supple. No rigidity.  Skin:    General: Skin is warm and dry.     Findings: Erythema present.  Neurological:     General: No focal deficit present.     Mental Status: She is alert and oriented to person, place, and time.     Cranial Nerves: No cranial nerve deficit.     Sensory: No sensory deficit.     Motor: No weakness.  Psychiatric:        Mood and Affect: Mood normal.        Behavior: Behavior normal.        Thought Content: Thought content normal.        Judgment: Judgment normal.     ED Results / Procedures / Treatments   Labs (all labs ordered are listed, but only abnormal results are displayed) Labs Reviewed - No data to display  EKG None  Radiology No results found.  Procedures Procedures   Medications Ordered in ED Medications - No data to display  ED Course  I have reviewed the triage vital signs and the nursing notes.  Pertinent labs & imaging results that were available during my care of the patient were reviewed by me and considered in my medical decision making (see chart for details).    MDM Rules/Calculators/A&P                           Patient presents for  concern of tick bite.  Tick bite occurred 5 days ago.  Tick was removed on that day.  She has since had a small area of erythema and pruritus surrounding the bite site.  She denies any systemic symptoms.  Swelling and redness to the  area have diminished since yesterday.  Patient is well-appearing upon arrival.  She has a small area of erythema without induration, fluctuance, or tenderness.  This appears to be a localized reaction to the insect.  Do not suspect cellulitis.  Given her absence of any other symptoms, do not suspect tickborne illness.  I did offer the patient exploration for possible foreign body (the head of the tick), which she declined.  Patient was given return precautions in the event that she experiences evidence of cellulitis and/or tickborne illness.  She was discharged in good condition.  Prior to leaving, patient did state that she would like prophylactic treatment for tickborne illness and cellulitis.  Doxycycline was prescribed.  Final Clinical Impression(s) / ED Diagnoses Final diagnoses:  Tick bite of left knee, initial encounter    Rx / DC Orders ED Discharge Orders          Ordered    doxycycline (VIBRAMYCIN) 100 MG capsule  2 times daily        11/01/20 1318             Gloris Manchester, MD 11/01/20 1308    Gloris Manchester, MD 11/01/20 1318    Gloris Manchester, MD 11/02/20 [phone]

## 2020-11-27 ENCOUNTER — Other Ambulatory Visit: Payer: Self-pay

## 2020-11-27 ENCOUNTER — Ambulatory Visit
Admission: RE | Admit: 2020-11-27 | Discharge: 2020-11-27 | Disposition: A | Discharge status: Home / Self Care | Payer: BC Managed Care – PPO | Source: Ambulatory Visit | Attending: Nurse Practitioner | Admitting: Nurse Practitioner

## 2020-11-27 DIAGNOSIS — Z1231 Encounter for screening mammogram for malignant neoplasm of breast: Secondary | ICD-10-CM

## 2020-12-03 ENCOUNTER — Other Ambulatory Visit: Payer: Self-pay | Admitting: Nurse Practitioner

## 2020-12-03 DIAGNOSIS — R928 Other abnormal and inconclusive findings on diagnostic imaging of breast: Secondary | ICD-10-CM

## 2020-12-14 ENCOUNTER — Encounter (INDEPENDENT_AMBULATORY_CARE_PROVIDER_SITE_OTHER): Payer: Self-pay

## 2020-12-22 ENCOUNTER — Ambulatory Visit
Admission: RE | Admit: 2020-12-22 | Discharge: 2020-12-22 | Disposition: A | Discharge status: Home / Self Care | Payer: BC Managed Care – PPO | Source: Ambulatory Visit | Attending: Nurse Practitioner | Admitting: Nurse Practitioner

## 2020-12-22 ENCOUNTER — Other Ambulatory Visit: Payer: Self-pay | Admitting: Nurse Practitioner

## 2020-12-22 ENCOUNTER — Other Ambulatory Visit: Payer: Self-pay

## 2020-12-22 DIAGNOSIS — R922 Inconclusive mammogram: Secondary | ICD-10-CM | POA: Diagnosis not present

## 2020-12-22 DIAGNOSIS — R928 Other abnormal and inconclusive findings on diagnostic imaging of breast: Secondary | ICD-10-CM

## 2021-03-24 DIAGNOSIS — N951 Menopausal and female climacteric states: Secondary | ICD-10-CM | POA: Diagnosis not present

## 2021-03-24 DIAGNOSIS — Z8639 Personal history of other endocrine, nutritional and metabolic disease: Secondary | ICD-10-CM | POA: Diagnosis not present

## 2021-03-24 DIAGNOSIS — E538 Deficiency of other specified B group vitamins: Secondary | ICD-10-CM | POA: Diagnosis not present

## 2021-03-24 DIAGNOSIS — K219 Gastro-esophageal reflux disease without esophagitis: Secondary | ICD-10-CM | POA: Diagnosis not present

## 2021-03-24 DIAGNOSIS — R4189 Other symptoms and signs involving cognitive functions and awareness: Secondary | ICD-10-CM | POA: Diagnosis not present

## 2021-03-24 DIAGNOSIS — E559 Vitamin D deficiency, unspecified: Secondary | ICD-10-CM | POA: Diagnosis not present

## 2021-03-24 DIAGNOSIS — Z131 Encounter for screening for diabetes mellitus: Secondary | ICD-10-CM | POA: Diagnosis not present

## 2021-05-20 DIAGNOSIS — K219 Gastro-esophageal reflux disease without esophagitis: Secondary | ICD-10-CM | POA: Diagnosis not present

## 2021-05-20 DIAGNOSIS — R4189 Other symptoms and signs involving cognitive functions and awareness: Secondary | ICD-10-CM | POA: Diagnosis not present

## 2021-05-20 DIAGNOSIS — Z8639 Personal history of other endocrine, nutritional and metabolic disease: Secondary | ICD-10-CM | POA: Diagnosis not present

## 2021-05-20 DIAGNOSIS — N951 Menopausal and female climacteric states: Secondary | ICD-10-CM | POA: Diagnosis not present

## 2021-06-24 ENCOUNTER — Encounter: Payer: BC Managed Care – PPO | Admitting: Nurse Practitioner

## 2022-01-04 ENCOUNTER — Encounter: Payer: Self-pay | Admitting: Nurse Practitioner

## 2022-01-04 ENCOUNTER — Ambulatory Visit (INDEPENDENT_AMBULATORY_CARE_PROVIDER_SITE_OTHER): Payer: BC Managed Care – PPO | Admitting: Nurse Practitioner

## 2022-01-04 VITALS — BP 128/70 | HR 84 | Temp 98.4°F | Ht 65.0 in | Wt 205.6 lb

## 2022-01-04 DIAGNOSIS — E559 Vitamin D deficiency, unspecified: Secondary | ICD-10-CM

## 2022-01-04 DIAGNOSIS — E6609 Other obesity due to excess calories: Secondary | ICD-10-CM | POA: Diagnosis not present

## 2022-01-04 DIAGNOSIS — R4189 Other symptoms and signs involving cognitive functions and awareness: Secondary | ICD-10-CM

## 2022-01-04 DIAGNOSIS — E041 Nontoxic single thyroid nodule: Secondary | ICD-10-CM | POA: Diagnosis not present

## 2022-01-04 DIAGNOSIS — Z6833 Body mass index (BMI) 33.0-33.9, adult: Secondary | ICD-10-CM

## 2022-01-04 DIAGNOSIS — Z0000 Encounter for general adult medical examination without abnormal findings: Secondary | ICD-10-CM

## 2022-01-04 DIAGNOSIS — R0789 Other chest pain: Secondary | ICD-10-CM

## 2022-01-04 MED ORDER — ALBUTEROL SULFATE HFA 108 (90 BASE) MCG/ACT IN AERS: 2.0000 | INHALATION_SPRAY | Freq: Four times a day (QID) | RESPIRATORY_TRACT | 2 refills | Status: DC | PRN

## 2022-01-04 NOTE — Progress Notes (Signed)
Joan Campbell,acting as a Education administrator for Joan Brine, FNP.,have documented all relevant documentation on the behalf of Joan Brine, FNP,as directed by  Joan Brine, FNP while in the presence of Joan Campbell, Index.   Subjective:     Patient ID: Joan Campbell Reason , female    DOB: 1965/12/05 , 56 y.o.   MRN: [id number]   Chief Complaint  Patient presents with   Annual Exam    HPI  Patient presents today for HM. Patient states compliance with medications.   When she was walking this occurred initially and did not get seen. Patient states when she works out her chest gets tight, patient states when she releases gas it is much better.   She did go see a natural health provider. She had a food allergy test was sensitive to chicken, spinach, onions, nut meg, caffeine, sweet potatoes and bran. She does not have her cervix.   She is no longer on estradiol patches due to the side effects.  She was having "brain fog", knows words and they will not come.   BP Readings from Last 3 Encounters: 01/04/22 : 128/70 11/01/20 : 128/87 06/18/20 : 128/70  Wt Readings from Last 3 Encounters: 01/04/22 : 205 lb 9.6 oz (93.3 kg) 11/01/20 : 183 lb (83 kg) 06/18/20 : 199 lb 6.4 oz (90.4 kg)        Past Medical History:  Diagnosis Date   Allergy    Anemia      Family History  Problem Relation Age of Onset   Cancer Mother    Diabetes Mother    Ulcers Father    Mental illness Sister    Crohn's disease Brother    Heart disease Maternal Grandmother    Kidney disease Maternal Grandfather    Heart disease Maternal Grandfather      Current Outpatient Medications:    albuterol (VENTOLIN HFA) 108 (90 Base) MCG/ACT inhaler, Inhale 2 puffs into the lungs every 6 (six) hours as needed for wheezing or shortness of breath., Disp: 8 g, Rfl: 2   fexofenadine (ALLEGRA) 180 MG tablet, Take 180 mg by mouth daily., Disp: , Rfl:    fluticasone (FLONASE) 50 MCG/ACT nasal spray, Place 1 spray into both nostrils  in the morning and at bedtime., Disp: 16 g, Rfl: 2   Multiple Vitamin (MULTIVITAMIN) tablet, Take 1 tablet by mouth daily., Disp: , Rfl:    No Known Allergies    The patient states she is status post hysterectomy.  No LMP recorded. Patient has had a hysterectomy.. Negative for Dysmenorrhea and Negative for Menorrhagia. Negative for: breast discharge, breast lump(s), breast pain and breast self exam. Associated symptoms include abnormal vaginal bleeding. Pertinent negatives include abnormal bleeding (hematology), anxiety, decreased libido, depression, difficulty falling sleep, dyspareunia, history of infertility, nocturia, sexual dysfunction, sleep disturbances, urinary incontinence, urinary urgency, vaginal discharge and vaginal itching. Diet regular. The patient states her exercise level is minimal - after not being at her home for 6 months she got off track. Plans to get back on track.   The patient's tobacco use is:  Social History   Tobacco Use  Smoking Status Never  Smokeless Tobacco Never  She has been exposed to passive smoke. The patient's alcohol use is:  Social History   Substance and Sexual Activity  Alcohol Use Never   Alcohol/week: 0.0 standard drinks of alcohol  Additional information: Last pap 06/18/2020, next one scheduled for 06/19/2023.    Review of Systems  Constitutional: Negative.  HENT: Negative.    Eyes: Negative.   Respiratory: Negative.    Cardiovascular: Negative.   Gastrointestinal: Negative.   Endocrine: Negative.   Genitourinary: Negative.   Musculoskeletal: Negative.   Skin: Negative.   Allergic/Immunologic: Negative.   Neurological: Negative.   Hematological: Negative.   Psychiatric/Behavioral: Negative.       Today's Vitals   01/04/22 1609  BP: 128/70  Pulse: 84  Temp: 98.4 F (36.9 C)  TempSrc: Oral  Weight: 205 lb 9.6 oz (93.3 kg)  Height: _0  (1.651 m)  PainSc: 0-No pain   Body mass index is 34.21 kg/m.  Wt Readings from Last 3  Encounters:  01/04/22 205 lb 9.6 oz (93.3 kg)  11/01/20 183 lb (83 kg)  06/18/20 199 lb 6.4 oz (90.4 kg)    Objective:  Physical Exam Vitals reviewed.  Constitutional:      General: She is not in acute distress.    Appearance: Normal appearance. She is well-developed. She is obese.  HENT:     Head: Normocephalic and atraumatic.     Right Ear: Hearing, tympanic membrane, ear canal and external ear normal. There is no impacted cerumen.     Left Ear: Hearing, tympanic membrane, ear canal and external ear normal. There is no impacted cerumen.     Nose:     Comments: Deferred - masked    Mouth/Throat:     Comments: Deferred - masked Eyes:     General: Lids are normal.     Extraocular Movements: Extraocular movements intact.     Conjunctiva/sclera: Conjunctivae normal.     Pupils: Pupils are equal, round, and reactive to light.     Funduscopic exam:    Right eye: No papilledema.        Left eye: No papilledema.  Neck:     Thyroid: No thyroid mass.     Vascular: No carotid bruit.  Cardiovascular:     Rate and Rhythm: Normal rate and regular rhythm.     Pulses: Normal pulses.     Heart sounds: Normal heart sounds. No murmur heard. Pulmonary:     Effort: Pulmonary effort is normal. No respiratory distress.     Breath sounds: Normal breath sounds. No wheezing.  Chest:     Chest wall: No mass.  Breasts:    Tanner Score is 5.     Right: Normal. No mass or tenderness.     Left: Normal. No mass or tenderness.  Abdominal:     General: Abdomen is flat. Bowel sounds are normal. There is no distension.     Palpations: Abdomen is soft.     Tenderness: There is no abdominal tenderness.  Musculoskeletal:        General: No swelling. Normal range of motion.     Cervical back: Full passive range of motion without pain, normal range of motion and neck supple.     Right lower leg: No edema.     Left lower leg: No edema.  Lymphadenopathy:     Upper Body:     Right upper body: No  supraclavicular, axillary or pectoral adenopathy.     Left upper body: No supraclavicular, axillary or pectoral adenopathy.  Skin:    General: Skin is warm and dry.     Capillary Refill: Capillary refill takes less than 2 seconds.  Neurological:     General: No focal deficit present.     Mental Status: She is alert and oriented to person, place, and time.  Cranial Nerves: No cranial nerve deficit.     Sensory: No sensory deficit.     Motor: No weakness.  Psychiatric:        Mood and Affect: Mood normal.        Behavior: Behavior normal.        Thought Content: Thought content normal.        Judgment: Judgment normal.         Assessment And Plan:     1. Encounter for health maintenance examination Behavior modifications discussed and diet history reviewed.   Pt will continue to exercise regularly and modify diet with low GI, plant based foods and decrease intake of processed foods.  Recommend intake of daily multivitamin, Vitamin D, and calcium.  Recommend mammogram and colonoscopy for preventive screenings, as well as recommend immunizations that include influenza, TDAP, and Shingles  2. Vitamin D deficiency Will check vitamin D level and supplement as needed.    Also encouraged to spend 15 minutes in the sun daily.  - Vitamin D (25 hydroxy)  3. Class 1 obesity due to excess calories without serious comorbidity with body mass index (BMI) of 33.0 to 33.9 in adult She is encouraged to strive for BMI less than 30 to decrease cardiac risk. Advised to aim for at least 150 minutes of exercise per week. - CMP14+EGFR - Hemoglobin A1c - Lipid panel  4. Brain fog Comments: Will check thyroid levels and vitamin B12. Pending results will consider starting veozah for her menopause this could be related - CBC - TSH - Vitamin B12  5. Feeling of chest tightness Comments: Will treat with albuterol inhaler this is likely exercise induced. EKG done with NSR HR 71 - albuterol (VENTOLIN  HFA) 108 (90 Base) MCG/ACT inhaler; Inhale 2 puffs into the lungs every 6 (six) hours as needed for wheezing or shortness of breath.  Dispense: 8 g; Refill: 2 - EKG 12-Lead   Patient was given opportunity to ask questions. Patient verbalized understanding of the plan and was able to repeat key elements of the plan. All questions were answered to their satisfaction.   Joan Brine, FNP   I, Joan Brine, FNP, have reviewed all documentation for this visit. The documentation on 01/27/22 for the exam, diagnosis, procedures, and orders are all accurate and complete.   THE PATIENT IS ENCOURAGED TO PRACTICE SOCIAL DISTANCING DUE TO THE COVID-19 PANDEMIC.

## 2022-01-04 NOTE — Patient Instructions (Signed)

## 2022-01-05 LAB — CMP14+EGFR
ALT: 27 IU/L (ref 0–32)
AST: 19 IU/L (ref 0–40)
Albumin/Globulin Ratio: 1.3 (ref 1.2–2.2)
Albumin: 4.2 g/dL (ref 3.8–4.9)
Alkaline Phosphatase: 97 IU/L (ref 44–121)
BUN/Creatinine Ratio: 9 (ref 9–23)
BUN: 7 mg/dL (ref 6–24)
Bilirubin Total: 0.5 mg/dL (ref 0.0–1.2)
CO2: 26 mmol/L (ref 20–29)
Calcium: 9.7 mg/dL (ref 8.7–10.2)
Chloride: 100 mmol/L (ref 96–106)
Creatinine, Ser: 0.76 mg/dL (ref 0.57–1.00)
Globulin, Total: 3.3 g/dL (ref 1.5–4.5)
Glucose: 83 mg/dL (ref 70–99)
Potassium: 3.9 mmol/L (ref 3.5–5.2)
Sodium: 139 mmol/L (ref 134–144)
Total Protein: 7.5 g/dL (ref 6.0–8.5)
eGFR: 91 mL/min/{1.73_m2} (ref >59)

## 2022-01-05 LAB — HEMOGLOBIN A1C
Est. average glucose Bld gHb Est-mCnc: 117 mg/dL
Hgb A1c MFr Bld: 5.7 % — ABNORMAL HIGH (ref 4.8–5.6)

## 2022-01-05 LAB — LIPID PANEL
Chol/HDL Ratio: 2.2 ratio (ref 0.0–4.4)
Cholesterol, Total: 119 mg/dL (ref 100–199)
HDL: 53 mg/dL (ref >39)
LDL Chol Calc (NIH): 54 mg/dL (ref 0–99)
Triglycerides: 54 mg/dL (ref 0–149)
VLDL Cholesterol Cal: 12 mg/dL (ref 5–40)

## 2022-01-05 LAB — VITAMIN B12: Vitamin B-12: 888 pg/mL (ref 232–1245)

## 2022-01-05 LAB — CBC
Hematocrit: 39 % (ref 34.0–46.6)
Hemoglobin: 13.8 g/dL (ref 11.1–15.9)
MCH: 32.4 pg (ref 26.6–33.0)
MCHC: 35.4 g/dL (ref 31.5–35.7)
MCV: 92 fL (ref 79–97)
Platelets: 300 10*3/uL (ref 150–450)
RBC: 4.26 x10E6/uL (ref 3.77–5.28)
RDW: 12.5 % (ref 11.7–15.4)
WBC: 7.7 10*3/uL (ref 3.4–10.8)

## 2022-01-05 LAB — VITAMIN D 25 HYDROXY (VIT D DEFICIENCY, FRACTURES): Vit D, 25-Hydroxy: 30.6 ng/mL (ref 30.0–100.0)

## 2022-01-05 LAB — TSH: TSH: 0.613 u[IU]/mL (ref 0.450–4.500)

## 2022-04-09 ENCOUNTER — Other Ambulatory Visit: Payer: Self-pay | Admitting: Nurse Practitioner

## 2022-07-22 DIAGNOSIS — R6882 Decreased libido: Secondary | ICD-10-CM | POA: Diagnosis not present

## 2022-07-22 DIAGNOSIS — Z131 Encounter for screening for diabetes mellitus: Secondary | ICD-10-CM | POA: Diagnosis not present

## 2022-07-22 DIAGNOSIS — E559 Vitamin D deficiency, unspecified: Secondary | ICD-10-CM | POA: Diagnosis not present

## 2022-07-22 DIAGNOSIS — R5383 Other fatigue: Secondary | ICD-10-CM | POA: Diagnosis not present

## 2022-07-22 DIAGNOSIS — N951 Menopausal and female climacteric states: Secondary | ICD-10-CM | POA: Diagnosis not present

## 2022-07-22 DIAGNOSIS — K219 Gastro-esophageal reflux disease without esophagitis: Secondary | ICD-10-CM | POA: Diagnosis not present

## 2022-07-22 DIAGNOSIS — Z1329 Encounter for screening for other suspected endocrine disorder: Secondary | ICD-10-CM | POA: Diagnosis not present

## 2022-07-22 DIAGNOSIS — R4189 Other symptoms and signs involving cognitive functions and awareness: Secondary | ICD-10-CM | POA: Diagnosis not present

## 2022-07-22 DIAGNOSIS — E538 Deficiency of other specified B group vitamins: Secondary | ICD-10-CM | POA: Diagnosis not present

## 2022-09-20 ENCOUNTER — Ambulatory Visit (HOSPITAL_COMMUNITY)
Admission: EM | Admit: 2022-09-20 | Discharge: 2022-09-20 | Disposition: A | Discharge status: Home / Self Care | Payer: BC Managed Care – PPO | Attending: Emergency Medicine | Admitting: Emergency Medicine

## 2022-09-20 ENCOUNTER — Encounter (HOSPITAL_COMMUNITY): Payer: Self-pay

## 2022-09-20 DIAGNOSIS — J069 Acute upper respiratory infection, unspecified: Secondary | ICD-10-CM | POA: Diagnosis not present

## 2022-09-20 MED ORDER — PREDNISONE 20 MG PO TABS: 40.0000 mg | ORAL_TABLET | Freq: Every day | ORAL | 0 refills | Status: DC

## 2022-09-20 MED ORDER — BENZONATATE 100 MG PO CAPS: 100.0000 mg | ORAL_CAPSULE | Freq: Three times a day (TID) | ORAL | 0 refills | Status: DC

## 2022-09-20 MED ORDER — PROMETHAZINE-DM 6.25-15 MG/5ML PO SYRP
5.0000 mL | ORAL_SOLUTION | Freq: Every evening | ORAL | 0 refills | Status: DC | PRN
Start: 2022-09-20 — End: 2023-03-09

## 2022-09-20 MED ORDER — AMOXICILLIN-POT CLAVULANATE 875-125 MG PO TABS: 1.0000 | ORAL_TABLET | Freq: Two times a day (BID) | ORAL | 0 refills | Status: DC

## 2022-09-20 NOTE — Discharge Instructions (Signed)
On exam your lungs are clear and you are getting enough air, at this time I do not believe that you have pneumonia therefore imaging has been held off  Begin Augmentin every morning and every evening for 7 days to provide coverage for bacteria that may be causing your symptoms to prolong  More morning take prednisone every morning with food for 5 days to open and relax the airway which should help settle harshness of cough, chest soreness from coughing and wheezing  Use albuterol inhaler as needed every 4-6 hours to help settle wheezing when present  You may use Tessalon pill every 8 hours to help calm your coughing  You may use cough syrup at bedtime for additional comfort, be mindful this may make you feel sleepy    You can take Tylenol and/or Ibuprofen as needed for fever reduction and pain relief.   For cough: honey 1/2 to 1 teaspoon (you can dilute the honey in water or another fluid).  You can also use guaifenesin and dextromethorphan for cough. You can use a humidifier for chest congestion and cough.  If you don't have a humidifier, you can sit in the bathroom with the hot shower running.      For sore throat: try warm salt water gargles, cepacol lozenges, throat spray, warm tea or water with lemon/honey, popsicles or ice, or OTC cold relief medicine for throat discomfort.   For congestion: take a daily anti-histamine like Zyrtec, Claritin, and a oral decongestant, such as pseudoephedrine.  You can also use Flonase 1-2 sprays in each nostril daily.   It is important to stay hydrated: drink plenty of fluids (water, gatorade/powerade/pedialyte, juices, or teas) to keep your throat moisturized and help further relieve irritation/discomfort.

## 2022-09-20 NOTE — ED Triage Notes (Signed)
Pt presents with c/o chest and nasal congestion X 1 week. Pt states it started with a dry cough. Reports she has taken Theraflu and cough suppressant.   Denies N-A-D and fever. Pt states she had a headache last night.

## 2022-09-20 NOTE — ED Provider Notes (Signed)
[facility]    CSN: [id number] Arrival date & time: 09/20/22  1904      History   Chief Complaint Chief Complaint  Patient presents with   Cough   chest congestion    HPI Joan Campbell is a 57 y.o. female.   Patient presents for evaluation of chills, nasal congestion, rhinorrhea, and a productive cough, wheezing and intermittent generalized headaches present for 7 days.  Cough initially was dry but is now productive with thick and sputum, feels as if this is coming from deep within the chest, exacerbated at nighttime.  Overnight has begun to experience wheezing but denies presence of shortness of breath.  Headaches have occurred twice with last occurrence overnight, described as severe, exacerbated by coughing.  No known sick contact prior.  Has attempted use of TheraFlu and Safe Tussin which has been minimally helpful.  Denies prior respiratory history, non-smoker.      Past Medical History:  Diagnosis Date   Allergy    Anemia     There are no problems to display for this patient.   Past Surgical History:  Procedure Laterality Date   ABDOMINAL HYSTERECTOMY     CESAREAN SECTION     TUBAL LIGATION      OB History   No obstetric history on file.      Home Medications    Prior to Admission medications   Medication Sig Start Date End Date Taking? Authorizing Provider  albuterol (VENTOLIN HFA) 108 (90 Base) MCG/ACT inhaler Inhale 2 puffs into the lungs every 6 (six) hours as needed for wheezing or shortness of breath. 01/04/22   Arnette Felts, FNP  fexofenadine (ALLEGRA) 180 MG tablet Take 180 mg by mouth daily.    [provider]  fluticasone (FLONASE) 50 MCG/ACT nasal spray Place 1 spray into both nostrils in the morning and at bedtime. 05/17/20   Particia Nearing, PA-C  Multiple Vitamin (MULTIVITAMIN) tablet Take 1 tablet by mouth daily.    [provider]    Family History Family History  Problem Relation Age of Onset    Cancer Mother    Diabetes Mother    Ulcers Father    Mental illness Sister    Crohn's disease Brother    Heart disease Maternal Grandmother    Kidney disease Maternal Grandfather    Heart disease Maternal Grandfather     Social History Social History   Tobacco Use   Smoking status: Never   Smokeless tobacco: Never  Vaping Use   Vaping status: Never Used  Substance Use Topics   Alcohol use: Never    Alcohol/week: 0.0 standard drinks of alcohol   Drug use: Never     Allergies   Patient has no known allergies.   Review of Systems Review of Systems  Constitutional:  Positive for chills. Negative for activity change, appetite change, diaphoresis, fatigue, fever and unexpected weight change.  HENT:  Positive for congestion and rhinorrhea. Negative for dental problem, drooling, ear discharge, ear pain, facial swelling, hearing loss, mouth sores, nosebleeds, postnasal drip, sinus pressure, sinus pain, sneezing, sore throat, tinnitus, trouble swallowing and voice change.   Respiratory:  Positive for cough and wheezing. Negative for apnea, choking, chest tightness, shortness of breath and stridor.   Cardiovascular: Negative.   Gastrointestinal: Negative.   Skin: Negative.   Neurological:  Positive for headaches. Negative for dizziness, tremors, seizures, syncope, facial asymmetry, speech difficulty, weakness, light-headedness and numbness.     Physical Exam Triage Vital Signs  ED Triage Vitals  Encounter Vitals Group     BP 09/20/22 1934 (!) 140/99     Systolic BP Percentile --      Diastolic BP Percentile --      Pulse Rate 09/20/22 1934 67     Resp 09/20/22 1934 17     Temp 09/20/22 1934 98.3 F (36.8 C)     Temp Source 09/20/22 1934 Oral     SpO2 09/20/22 1934 97 %     Weight --      Height --      Head Circumference --      Peak Flow --      Pain Score 09/20/22 1933 4     Pain Loc --      Pain Education --      Exclude from Growth Chart --    No data  found.  Updated Vital Signs BP (!) 140/99 (BP Location: Left Arm)   Pulse 67   Temp 98.3 F (36.8 C) (Oral)   Resp 17   SpO2 97%   Visual Acuity Right Eye Distance:   Left Eye Distance:   Bilateral Distance:    Right Eye Near:   Left Eye Near:    Bilateral Near:     Physical Exam Constitutional:      Appearance: Normal appearance.  HENT:     Head: Normocephalic.     Right Ear: Tympanic membrane, ear canal and external ear normal.     Left Ear: Tympanic membrane, ear canal and external ear normal.     Nose: Congestion and rhinorrhea present.     Mouth/Throat:     Mouth: Mucous membranes are moist.     Pharynx: Posterior oropharyngeal erythema present.  Eyes:     Extraocular Movements: Extraocular movements intact.  Cardiovascular:     Rate and Rhythm: Normal rate and regular rhythm.     Pulses: Normal pulses.     Heart sounds: Normal heart sounds.  Pulmonary:     Effort: Pulmonary effort is normal.     Breath sounds: Normal breath sounds.  Skin:    General: Skin is warm and dry.  Neurological:     General: No focal deficit present.     Mental Status: She is alert and oriented to person, place, and time. Mental status is at baseline.      UC Treatments / Results  Labs (all labs ordered are listed, but only abnormal results are displayed) Labs Reviewed - No data to display  EKG   Radiology No results found.  Procedures Procedures (including critical care time)  Medications Ordered in UC Medications - No data to display  Initial Impression / Assessment and Plan / UC Course  I have reviewed the triage vital signs and the nursing notes.  Pertinent labs & imaging results that were available during my care of the patient were reviewed by me and considered in my medical decision making (see chart for details).  Acute upper respiratory infection  Patient is in no signs of distress nor toxic appearing.  Vital signs are stable.  Low suspicion for pneumonia,  pneumothorax or bronchitis and therefore will defer imaging.  Viral testing deferred due to timeline of illness.  As symptoms are progressing without signs of improvement prophylactically providing antibiotic, Augmentin sent to pharmacy, for management of persistent coughing and wheezing prescribed prednisone, Tessalon and Promethazine DM.May use additional over-the-counter medications as needed for supportive care.  May follow-up with urgent care as needed if symptoms  persist or worsen.   Final Clinical Impressions(s) / UC Diagnoses   Final diagnoses:  None   Discharge Instructions   None    ED Prescriptions   None    PDMP not reviewed this encounter.   Valinda Hoar, [place] 09/23/22 [phone]

## 2022-10-13 DIAGNOSIS — G4733 Obstructive sleep apnea (adult) (pediatric): Secondary | ICD-10-CM | POA: Diagnosis not present

## 2022-10-13 DIAGNOSIS — G4719 Other hypersomnia: Secondary | ICD-10-CM | POA: Diagnosis not present

## 2022-10-13 DIAGNOSIS — G2581 Restless legs syndrome: Secondary | ICD-10-CM | POA: Diagnosis not present

## 2022-10-28 DIAGNOSIS — E559 Vitamin D deficiency, unspecified: Secondary | ICD-10-CM | POA: Diagnosis not present

## 2022-10-28 DIAGNOSIS — Z1329 Encounter for screening for other suspected endocrine disorder: Secondary | ICD-10-CM | POA: Diagnosis not present

## 2022-10-28 DIAGNOSIS — N951 Menopausal and female climacteric states: Secondary | ICD-10-CM | POA: Diagnosis not present

## 2022-10-28 DIAGNOSIS — Z131 Encounter for screening for diabetes mellitus: Secondary | ICD-10-CM | POA: Diagnosis not present

## 2022-10-28 DIAGNOSIS — E538 Deficiency of other specified B group vitamins: Secondary | ICD-10-CM | POA: Diagnosis not present

## 2022-10-28 DIAGNOSIS — R5383 Other fatigue: Secondary | ICD-10-CM | POA: Diagnosis not present

## 2022-11-11 DIAGNOSIS — K219 Gastro-esophageal reflux disease without esophagitis: Secondary | ICD-10-CM | POA: Diagnosis not present

## 2022-11-11 DIAGNOSIS — Z1329 Encounter for screening for other suspected endocrine disorder: Secondary | ICD-10-CM | POA: Diagnosis not present

## 2022-11-11 DIAGNOSIS — N951 Menopausal and female climacteric states: Secondary | ICD-10-CM | POA: Diagnosis not present

## 2022-11-11 DIAGNOSIS — R5383 Other fatigue: Secondary | ICD-10-CM | POA: Diagnosis not present

## 2022-12-06 DIAGNOSIS — G4733 Obstructive sleep apnea (adult) (pediatric): Secondary | ICD-10-CM | POA: Diagnosis not present

## 2023-01-06 DIAGNOSIS — G4733 Obstructive sleep apnea (adult) (pediatric): Secondary | ICD-10-CM | POA: Diagnosis not present

## 2023-01-10 ENCOUNTER — Encounter: Payer: BC Managed Care – PPO | Admitting: Nurse Practitioner

## 2023-02-05 DIAGNOSIS — G4733 Obstructive sleep apnea (adult) (pediatric): Secondary | ICD-10-CM | POA: Diagnosis not present

## 2023-03-02 DIAGNOSIS — G4733 Obstructive sleep apnea (adult) (pediatric): Secondary | ICD-10-CM | POA: Diagnosis not present

## 2023-03-09 ENCOUNTER — Ambulatory Visit (INDEPENDENT_AMBULATORY_CARE_PROVIDER_SITE_OTHER): Payer: BC Managed Care – PPO | Admitting: Family Medicine

## 2023-03-09 ENCOUNTER — Encounter: Payer: Self-pay | Admitting: Family Medicine

## 2023-03-09 VITALS — BP 110/88 | HR 85 | Temp 97.9°F | Ht 64.25 in | Wt 200.4 lb

## 2023-03-09 DIAGNOSIS — T7840XA Allergy, unspecified, initial encounter: Secondary | ICD-10-CM | POA: Insufficient documentation

## 2023-03-09 DIAGNOSIS — Z1231 Encounter for screening mammogram for malignant neoplasm of breast: Secondary | ICD-10-CM

## 2023-03-09 DIAGNOSIS — J3089 Other allergic rhinitis: Secondary | ICD-10-CM

## 2023-03-09 DIAGNOSIS — R1011 Right upper quadrant pain: Secondary | ICD-10-CM

## 2023-03-09 DIAGNOSIS — J309 Allergic rhinitis, unspecified: Secondary | ICD-10-CM | POA: Insufficient documentation

## 2023-03-09 NOTE — Assessment & Plan Note (Signed)
Chronic, year round, she currently takes OTC allegra 160 mg daily and occasionally takes PRN flonase. She does not ned refills at this time. We discussed other medications as can use if the antihistamines become ineffective. RTC for annual physical at patient's convenience

## 2023-03-09 NOTE — Progress Notes (Signed)
New Patient Office Visit  Subjective   Patient ID: Joan Campbell, female    DOB: Aug 18, 1965  Age: 58 y.o. MRN: [id number]  CC:  Chief Complaint  Patient presents with   Establish Care    HPI Joan Campbell presents to establish care We previously seen by [facility] in 2023. Patient is reporting intermittent/ occasional pain in the right upper quadrant of the abdomen. States that there were no other symptoms except a lot of gas, states that there was no nausea, no fever or chills, no constipation. Reports that it essentially resolved and hasn't had it for a month or two.   Pt reports she did have a colonoscopy in 2022, had hysterectomy 2010, no longer requires pap smears.   Pt reports she is intolerant to caffeine due to it causing migraine headaches. States she does not consume any caffeine.   Pt has a history of allergies. Was given an inhaler as well due to possible exercise induce States that's he has been using allegra and flonase occasionally. Her allergies are year round for the most part. Pt states she recently did have a sleep study.  Current Outpatient Medications  Medication Instructions   albuterol (VENTOLIN HFA) 108 (90 Base) MCG/ACT inhaler 2 puffs, Inhalation, Every 6 hours PRN   fexofenadine (ALLEGRA) 180 mg, Daily   fluticasone (FLONASE) 50 MCG/ACT nasal spray 1 spray, Each Nare, 2 times daily   Multiple Vitamin (MULTIVITAMIN) tablet 1 tablet, Daily    Past Medical History:  Diagnosis Date   Allergy    Anemia     Past Surgical History:  Procedure Laterality Date   ABDOMINAL HYSTERECTOMY     CESAREAN SECTION     TUBAL LIGATION      Family History  Problem Relation Age of Onset   Cancer Mother    Diabetes Mother    Ulcers Father    Mental illness Sister    Crohn's disease Brother    Heart disease Maternal Grandmother    Kidney disease Maternal Grandfather    Heart disease Maternal Grandfather     Social History   Socioeconomic  History   Marital status: Married    Spouse name: Not on file   Number of children: Not on file   Years of education: Not on file   Highest education level: Not on file  Occupational History   Not on file  Tobacco Use   Smoking status: Never   Smokeless tobacco: Never  Vaping Use   Vaping status: Never Used  Substance and Sexual Activity   Alcohol use: Not Currently   Drug use: Never   Sexual activity: Yes    Birth control/protection: Surgical  Other Topics Concern   Not on file  Social History Narrative   Not on file   Social Drivers of Health   Financial Resource Strain: Not on file  Food Insecurity: Not on file  Transportation Needs: Not on file  Physical Activity: Not on file  Stress: Not on file  Social Connections: Not on file  Intimate Partner Violence: Not on file    Review of Systems  All other systems reviewed and are negative.       Objective   BP 110/88   Pulse 85   Temp 97.9 F (36.6 C) (Oral)   Ht 5' 4.25" (1.632 m)   Wt 200 lb 6.4 oz (90.9 kg)   SpO2 99%   BMI 34.13 kg/m   Physical Exam Vitals reviewed.  Constitutional:      Appearance: Normal appearance. She is well-groomed and normal weight.  Eyes:     Conjunctiva/sclera: Conjunctivae normal.  Neck:     Thyroid: No thyromegaly.  Cardiovascular:     Rate and Rhythm: Normal rate and regular rhythm.     Pulses: Normal pulses.     Heart sounds: S1 normal and S2 normal.  Pulmonary:     Effort: Pulmonary effort is normal.     Breath sounds: Normal breath sounds and air entry.  Abdominal:     General: Bowel sounds are normal.  Musculoskeletal:     Right lower leg: No edema.     Left lower leg: No edema.  Neurological:     Mental Status: She is alert and oriented to person, place, and time. Mental status is at baseline.     Gait: Gait is intact.  Psychiatric:        Mood and Affect: Mood and affect normal.        Speech: Speech normal.        Behavior: Behavior normal.         Judgment: Judgment normal.         Assessment & Plan:  Breast cancer screening by mammogram -     3D Screening Mammogram, Left and Right; Future  Non-seasonal allergic rhinitis, unspecified trigger Assessment & Plan: Chronic, year round, she currently takes OTC allegra 160 mg daily and occasionally takes PRN flonase. She does not ned refills at this time. We discussed other medications as can use if the antihistamines become ineffective. RTC for annual physical at patient's convenience    RUQ abdominal pain  Essentially resolved. Etiology can represent GB pain/ dyskinesia vs gaseous distension/ bloating from excessive gas. I advised her to continue monitoring for any symptoms, especially around mealtime and if her symptoms recur to schedule an appt and we will further her work up.   Return for annual physical exam - at patient's convenience.   Karie Georges, MD

## 2023-03-20 ENCOUNTER — Other Ambulatory Visit: Payer: Self-pay

## 2023-03-20 ENCOUNTER — Encounter: Payer: Self-pay | Admitting: *Deleted

## 2023-03-20 ENCOUNTER — Ambulatory Visit
Admission: EM | Admit: 2023-03-20 | Discharge: 2023-03-20 | Disposition: A | Discharge status: Home / Self Care | Payer: BC Managed Care – PPO | Attending: Physician Assistant | Admitting: Physician Assistant

## 2023-03-20 DIAGNOSIS — U071 COVID-19: Secondary | ICD-10-CM | POA: Diagnosis not present

## 2023-03-20 DIAGNOSIS — R0789 Other chest pain: Secondary | ICD-10-CM

## 2023-03-20 DIAGNOSIS — R051 Acute cough: Secondary | ICD-10-CM

## 2023-03-20 LAB — POC COVID19/FLU A&B COMBO
Covid Antigen, POC: POSITIVE — AB
Influenza A Antigen, POC: NEGATIVE
Influenza B Antigen, POC: NEGATIVE

## 2023-03-20 MED ORDER — PAXLOVID (300/100) 20 X 150 MG & 10 X 100MG PO TBPK
3.0000 | ORAL_TABLET | Freq: Two times a day (BID) | ORAL | 0 refills | Status: AC
Start: 2023-03-20 — End: 2023-03-25

## 2023-03-20 MED ORDER — PROMETHAZINE-DM 6.25-15 MG/5ML PO SYRP: 5.0000 mL | ORAL_SOLUTION | Freq: Two times a day (BID) | ORAL | 0 refills | Status: DC | PRN

## 2023-03-20 MED ORDER — ALBUTEROL SULFATE HFA 108 (90 BASE) MCG/ACT IN AERS: 2.0000 | INHALATION_SPRAY | Freq: Four times a day (QID) | RESPIRATORY_TRACT | 0 refills | Status: AC | PRN

## 2023-03-20 NOTE — ED Provider Notes (Signed)
[facility]    CSN: [id number] Arrival date & time: 03/20/23  1831      History   Chief Complaint Chief Complaint  Patient presents with   Cough    HPI Joan Campbell is a 58 y.o. female.   Patient presents today accompanied by her husband.  Reports a 2-day history of URI symptoms including sore throat, body aches, chills, subjective fever.  Denies any chest pain, shortness of breath, nausea/vomiting interfering with oral intake.  She has been taking TheraFlu without improvement of symptoms.  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  She denies any recent antibiotics or steroids.  She denies any known sick contacts.  She does not smoke.  She has had COVID several years ago.  She had initial COVID-19 vaccinations but has not had most recent booster.    Past Medical History:  Diagnosis Date   Allergy    Anemia     Patient Active Problem List   Diagnosis Date Noted   Allergic rhinitis 03/09/2023    Past Surgical History:  Procedure Laterality Date   ABDOMINAL HYSTERECTOMY     CESAREAN SECTION     TUBAL LIGATION      OB History   No obstetric history on file.      Home Medications    Prior to Admission medications   Medication Sig Start Date End Date Taking? Authorizing Provider  fexofenadine (ALLEGRA) 180 MG tablet Take 180 mg by mouth daily.   Yes [provider]  Multiple Vitamin (MULTIVITAMIN) tablet Take 1 tablet by mouth daily.   Yes [provider]  nirmatrelvir/ritonavir (PAXLOVID, 300/100,) 20 x 150 MG & 10 x 100MG  TBPK Take 3 tablets by mouth 2 (two) times daily for 5 days. Take nirmatrelvir (150 mg) two tablets twice daily for 5 days and ritonavir (100 mg) one tablet twice daily for 5 days. 03/20/23 03/25/23 Yes Charmon Thorson K, PA-C  promethazine-dextromethorphan (PROMETHAZINE-DM) 6.25-15 MG/5ML syrup Take 5 mLs by mouth 2 (two) times daily as  needed for cough. 03/20/23  Yes Ambra Haverstick K, PA-C  albuterol (VENTOLIN HFA) 108 (90 Base) MCG/ACT inhaler Inhale 2 puffs into the lungs every 6 (six) hours as needed for wheezing or shortness of breath. 03/20/23   Elnore Cosens K, PA-C  fluticasone (FLONASE) 50 MCG/ACT nasal spray Place 1 spray into both nostrils in the morning and at bedtime. 05/17/20   Particia Nearing, PA-C    Family History Family History  Problem Relation Age of Onset   Cancer Mother    Diabetes Mother    Ulcers Father    Mental illness Sister    Crohn's disease Brother    Heart disease Maternal Grandmother    Kidney disease Maternal Grandfather    Heart disease Maternal Grandfather     Social History Social History   Tobacco Use   Smoking status: Never   Smokeless tobacco: Never  Vaping Use   Vaping status: Never Used  Substance Use Topics   Alcohol use: Not Currently   Drug use: Never     Allergies   Patient has no known allergies.   Review of Systems Review of Systems  Constitutional:  Positive for activity change and fever. Negative for appetite change and fatigue.  HENT:  Positive for congestion and sore throat. Negative for sinus pressure and sneezing.   Respiratory:  Positive for cough. Negative for shortness of breath.  Cardiovascular:  Negative for chest pain.  Gastrointestinal:  Negative for abdominal pain, diarrhea, nausea and vomiting.  Musculoskeletal:  Positive for arthralgias and myalgias.  Neurological:  Positive for headaches. Negative for dizziness and light-headedness.     Physical Exam Triage Vital Signs ED Triage Vitals  Encounter Vitals Group     BP 03/20/23 2001 106/76     Systolic BP Percentile --      Diastolic BP Percentile --      Pulse Rate 03/20/23 2001 92     Resp 03/20/23 2001 18     Temp 03/20/23 2001 100 F (37.8 C)     Temp Source 03/20/23 2001 Oral     SpO2 03/20/23 2001 97 %     Weight --      Height --      Head Circumference --      Peak  Flow --      Pain Score 03/20/23 1958 6     Pain Loc --      Pain Education --      Exclude from Growth Chart --    No data found.  Updated Vital Signs BP 106/76 (BP Location: Left Arm)   Pulse 92   Temp 100 F (37.8 C) (Oral)   Resp 18   SpO2 97%   Visual Acuity Right Eye Distance:   Left Eye Distance:   Bilateral Distance:    Right Eye Near:   Left Eye Near:    Bilateral Near:     Physical Exam Vitals reviewed.  Constitutional:      General: She is awake. She is not in acute distress.    Appearance: Normal appearance. She is well-developed. She is not ill-appearing.     Comments: Very pleasant female appears stated age in no acute distress sitting comfortable in exam room  HENT:     Head: Normocephalic and atraumatic.     Right Ear: Tympanic membrane, ear canal and external ear normal. Tympanic membrane is not erythematous or bulging.     Left Ear: Tympanic membrane, ear canal and external ear normal. Tympanic membrane is not erythematous or bulging.     Nose:     Right Sinus: No maxillary sinus tenderness or frontal sinus tenderness.     Left Sinus: No maxillary sinus tenderness or frontal sinus tenderness.     Mouth/Throat:     Pharynx: Uvula midline. No oropharyngeal exudate or posterior oropharyngeal erythema.  Cardiovascular:     Rate and Rhythm: Normal rate and regular rhythm.     Heart sounds: Normal heart sounds, S1 normal and S2 normal. No murmur heard. Pulmonary:     Effort: Pulmonary effort is normal.     Breath sounds: Normal breath sounds. No wheezing, rhonchi or rales.     Comments: Reactive cough with deep breathing Psychiatric:        Behavior: Behavior is cooperative.      UC Treatments / Results  Labs (all labs ordered are listed, but only abnormal results are displayed) Labs Reviewed  POC COVID19/FLU A&B COMBO - Abnormal; Notable for the following components:      Result Value   Covid Antigen, POC Positive (*)    All other components  within normal limits    EKG   Radiology No results found.  Procedures Procedures (including critical care time)  Medications Ordered in UC Medications - No data to display  Initial Impression / Assessment and Plan / UC Course  I have reviewed the triage  vital signs and the nursing notes.  Pertinent labs & imaging results that were available during my care of the patient were reviewed by me and considered in my medical decision making (see chart for details).     Patient is well-appearing, afebrile, nontoxic, nontachycardic.  No evidence of acute infection that would warrant initiation of antibiotics based on exam today.  Chest x-ray was deferred as her oxygen saturation was 97% without adventitious lung sounds.  She does a positive for COVID-19.  She is within the window of effectiveness for Paxlovid and is a candidate based on her history of exercise-induced asthma and age over 54.  We do not have a recent metabolic panel but her last kidney function from 2023 was normal so we we will send in Paxlovid normal dosing.  No medication adjustments required.  She was provided a refill of her albuterol inhaler with instruction to use this every 4-6 hours as needed.  Promethazine DM was sent for cough.  We discussed that this can be sedating and she is not to drive or drink alcohol taking it.  Recommended that she use over-the-counter medications for symptom management.  If she has any worsening or changing symptoms including shortness of breath, chest pain, high fever, nausea/vomiting interfering with oral intake, weakness she needs to be seen immediately.  Strict return precautions given.  Work excuse note provided.  Recommend close follow with her primary care.   Final Clinical Impressions(s) / UC Diagnoses   Final diagnoses:  COVID-19  Acute cough     Discharge Instructions      You tested positive for COVID-19.  Start Paxlovid twice daily for 5 days.  Take Promethazine DM for cough.   This will make you sleepy so do not drive drink alcohol with this medication.  Use albuterol every 4-6 hours as needed.  Make sure you rest and drink plenty of fluid.  If your symptoms are not improving within a week or if anything worsens and you have shortness of breath, worsening cough, chest pain, high fever not responding to medication, weakness you need to go to the emergency room.     ED Prescriptions     Medication Sig Dispense Auth. Provider   albuterol (VENTOLIN HFA) 108 (90 Base) MCG/ACT inhaler Inhale 2 puffs into the lungs every 6 (six) hours as needed for wheezing or shortness of breath. 18 g Ahyan Kreeger K, PA-C   nirmatrelvir/ritonavir (PAXLOVID, 300/100,) 20 x 150 MG & 10 x 100MG  TBPK Take 3 tablets by mouth 2 (two) times daily for 5 days. Take nirmatrelvir (150 mg) two tablets twice daily for 5 days and ritonavir (100 mg) one tablet twice daily for 5 days. 30 tablet Kaid Seeberger K, PA-C   promethazine-dextromethorphan (PROMETHAZINE-DM) 6.25-15 MG/5ML syrup Take 5 mLs by mouth 2 (two) times daily as needed for cough. 118 mL Brigida Scotti K, PA-C      PDMP not reviewed this encounter.   Jeani Hawking, Cordelia Poche 03/20/23 2037

## 2023-03-20 NOTE — Discharge Instructions (Signed)
You tested positive for COVID-19.  Start Paxlovid twice daily for 5 days.  Take Promethazine DM for cough.  This will make you sleepy so do not drive drink alcohol with this medication.  Use albuterol every 4-6 hours as needed.  Make sure you rest and drink plenty of fluid.  If your symptoms are not improving within a week or if anything worsens and you have shortness of breath, worsening cough, chest pain, high fever not responding to medication, weakness you need to go to the emergency room.

## 2023-03-20 NOTE — Progress Notes (Incomplete)
Virtual Visit via Video Note I connected with Joan Campbell on 03/21/2023 by a video enabled telemedicine application and verified that I am speaking with the correct person using two identifiers. Location patient: home Location provider:work or home office Persons participating in the virtual visit: patient, provider  I discussed the limitations of evaluation and management by telemedicine and the availability of in person appointments. The patient expressed understanding and agreed to proceed.  No chief complaint on file.   HPI:  [name] is a 58 y.o. female with a PMHx significant for allergic rhinitis who is being seen on video today for flu-like symptoms.   ROS: See pertinent positives and negatives per HPI.  Past Medical History:  Diagnosis Date   Allergy    Anemia     Past Surgical History:  Procedure Laterality Date   ABDOMINAL HYSTERECTOMY     CESAREAN SECTION     TUBAL LIGATION      Family History  Problem Relation Age of Onset   Cancer Mother    Diabetes Mother    Ulcers Father    Mental illness Sister    Crohn's disease Brother    Heart disease Maternal Grandmother    Kidney disease Maternal Grandfather    Heart disease Maternal Grandfather     Social History   Socioeconomic History   Marital status: Married    Spouse name: Not on file   Number of children: Not on file   Years of education: Not on file   Highest education level: Not on file  Occupational History   Not on file  Tobacco Use   Smoking status: Never   Smokeless tobacco: Never  Vaping Use   Vaping status: Never Used  Substance and Sexual Activity   Alcohol use: Not Currently   Drug use: Never   Sexual activity: Yes    Birth control/protection: Surgical  Other Topics Concern   Not on file  Social History Narrative   Not on file   Social Drivers of Health   Financial Resource Strain: Not on file  Food Insecurity: Not on file  Transportation Needs: Not on file   Physical Activity: Not on file  Stress: Not on file  Social Connections: Not on file  Intimate Partner Violence: Not on file     Current Outpatient Medications:    albuterol (VENTOLIN HFA) 108 (90 Base) MCG/ACT inhaler, Inhale 2 puffs into the lungs every 6 (six) hours as needed for wheezing or shortness of breath., Disp: 8 g, Rfl: 2   fexofenadine (ALLEGRA) 180 MG tablet, Take 180 mg by mouth daily., Disp: , Rfl:    fluticasone (FLONASE) 50 MCG/ACT nasal spray, Place 1 spray into both nostrils in the morning and at bedtime., Disp: 16 g, Rfl: 2   Multiple Vitamin (MULTIVITAMIN) tablet, Take 1 tablet by mouth daily., Disp: , Rfl:   EXAM:  VITALS per patient if applicable:  GENERAL: alert, oriented, appears well and in no acute distress  HEENT: atraumatic, conjunctiva clear, no obvious abnormalities on inspection of external nose and ears  NECK: normal movements of the head and neck  LUNGS: on inspection no signs of respiratory distress, breathing rate appears normal, no obvious gross SOB, gasping or wheezing  CV: no obvious cyanosis  MS: moves all visible extremities without noticeable abnormality  PSYCH/NEURO: pleasant and cooperative, no obvious depression or anxiety, speech and thought processing grossly intact  ASSESSMENT AND PLAN:  Discussed the following assessment and plan:  No diagnosis  found.  There are no diagnoses linked to this encounter.  We discussed possible serious and likely etiologies, options for evaluation and workup, limitations of telemedicine visit vs in person visit, treatment, treatment risks and precautions. The patient was advised to call back or seek an in-person evaluation if the symptoms worsen or if the condition fails to improve as anticipated. I discussed the assessment and treatment plan with the patient. The patient was provided an opportunity to ask questions and all were answered. The patient agreed with the plan and demonstrated an  understanding of the instructions.  No follow-ups on file.  I, Rolla Etienne Wierda, acting as a scribe for Betty [place], MD., have documented all relevant documentation on the behalf of Betty [place], MD, as directed by  Betty [place], MD while in the presence of Betty [place], MD.   I, Betty [place], MD, have reviewed all documentation for this visit. The documentation on 03/20/23 for the exam, diagnosis, procedures, and orders are all accurate and complete.  Rolla Etienne Dossie Der ***

## 2023-03-20 NOTE — ED Triage Notes (Signed)
Cough started Saturday night with sore throat. Body aches. Chills. Theraflu earlier today. Hurts to cough.

## 2023-03-21 ENCOUNTER — Ambulatory Visit: Payer: BC Managed Care – PPO | Admitting: Family Medicine

## 2023-03-24 ENCOUNTER — Ambulatory Visit: Payer: BC Managed Care – PPO

## 2023-04-18 ENCOUNTER — Encounter: Payer: Self-pay | Admitting: Family Medicine

## 2023-04-18 ENCOUNTER — Ambulatory Visit (INDEPENDENT_AMBULATORY_CARE_PROVIDER_SITE_OTHER): Payer: BC Managed Care – PPO | Admitting: Family Medicine

## 2023-04-18 VITALS — BP 112/80 | HR 80 | Temp 98.3°F | Ht 65.0 in | Wt 205.9 lb

## 2023-04-18 DIAGNOSIS — R739 Hyperglycemia, unspecified: Secondary | ICD-10-CM

## 2023-04-18 DIAGNOSIS — G4733 Obstructive sleep apnea (adult) (pediatric): Secondary | ICD-10-CM | POA: Insufficient documentation

## 2023-04-18 DIAGNOSIS — Z13220 Encounter for screening for lipoid disorders: Secondary | ICD-10-CM | POA: Diagnosis not present

## 2023-04-18 DIAGNOSIS — Z0000 Encounter for general adult medical examination without abnormal findings: Secondary | ICD-10-CM

## 2023-04-18 LAB — COMPREHENSIVE METABOLIC PANEL
ALT: 30 U/L (ref 0–35)
AST: 21 U/L (ref 0–37)
Albumin: 4.3 g/dL (ref 3.5–5.2)
Alkaline Phosphatase: 78 U/L (ref 39–117)
BUN: 10 mg/dL (ref 6–23)
CO2: 29 meq/L (ref 19–32)
Calcium: 9.3 mg/dL (ref 8.4–10.5)
Chloride: 103 meq/L (ref 96–112)
Creatinine, Ser: 0.75 mg/dL (ref 0.40–1.20)
GFR: 87.61 mL/min (ref >60.00)
Glucose, Bld: 82 mg/dL (ref 70–99)
Potassium: 4.2 meq/L (ref 3.5–5.1)
Sodium: 138 meq/L (ref 135–145)
Total Bilirubin: 0.6 mg/dL (ref 0.2–1.2)
Total Protein: 7.3 g/dL (ref 6.0–8.3)

## 2023-04-18 LAB — LIPID PANEL
Cholesterol: 130 mg/dL (ref 0–200)
HDL: 66.6 mg/dL (ref >39.00)
LDL Cholesterol: 53 mg/dL (ref 0–99)
NonHDL: 63.23
Total CHOL/HDL Ratio: 2
Triglycerides: 51 mg/dL (ref 0.0–149.0)
VLDL: 10.2 mg/dL (ref 0.0–40.0)

## 2023-04-18 LAB — CBC WITH DIFFERENTIAL/PLATELET
Basophils Absolute: 0.1 10*3/uL (ref 0.0–0.1)
Basophils Relative: 0.8 % (ref 0.0–3.0)
Eosinophils Absolute: 0.1 10*3/uL (ref 0.0–0.7)
Eosinophils Relative: 1.8 % (ref 0.0–5.0)
HCT: 38.9 % (ref 36.0–46.0)
Hemoglobin: 13.1 g/dL (ref 12.0–15.0)
Lymphocytes Relative: 29.3 % (ref 12.0–46.0)
Lymphs Abs: 1.9 10*3/uL (ref 0.7–4.0)
MCHC: 33.7 g/dL (ref 30.0–36.0)
MCV: 94.8 fl (ref 78.0–100.0)
Monocytes Absolute: 0.4 10*3/uL (ref 0.1–1.0)
Monocytes Relative: 5.8 % (ref 3.0–12.0)
Neutro Abs: 4 10*3/uL (ref 1.4–7.7)
Neutrophils Relative %: 62.3 % (ref 43.0–77.0)
Platelets: 281 10*3/uL (ref 150.0–400.0)
RBC: 4.1 Mil/uL (ref 3.87–5.11)
RDW: 14 % (ref 11.5–15.5)
WBC: 6.4 10*3/uL (ref 4.0–10.5)

## 2023-04-18 LAB — HEMOGLOBIN A1C: Hgb A1c MFr Bld: 5.9 % (ref 4.6–6.5)

## 2023-04-18 NOTE — Patient Instructions (Addendum)
 Take melatonin starting at 5 mg at night to help with sleep at night  Acidophilles Lactobacillus for a probiotic  Health Maintenance, Female Adopting a healthy lifestyle and getting preventive care are important in promoting health and wellness. Ask your health care provider about: The right schedule for you to have regular tests and exams. Things you can do on your own to prevent diseases and keep yourself healthy. What should I know about diet, weight, and exercise? Eat a healthy diet  Eat a diet that includes plenty of vegetables, fruits, low-fat dairy products, and lean protein. Do not eat a lot of foods that are high in solid fats, added sugars, or sodium. Maintain a healthy weight Body mass index (BMI) is used to identify weight problems. It estimates body fat based on height and weight. Your health care provider can help determine your BMI and help you achieve or maintain a healthy weight. Get regular exercise Get regular exercise. This is one of the most important things you can do for your health. Most adults should: Exercise for at least 150 minutes each week. The exercise should increase your heart rate and make you sweat (moderate-intensity exercise). Do strengthening exercises at least twice a week. This is in addition to the moderate-intensity exercise. Spend less time sitting. Even light physical activity can be beneficial. Watch cholesterol and blood lipids Have your blood tested for lipids and cholesterol at 59 years of age, then have this test every 5 years. Have your cholesterol levels checked more often if: Your lipid or cholesterol levels are high. You are older than 59 years of age. You are at high risk for heart disease. What should I know about cancer screening? Depending on your health history and family history, you may need to have cancer screening at various ages. This may include screening for: Breast cancer. Cervical cancer. Colorectal cancer. Skin  cancer. Lung cancer. What should I know about heart disease, diabetes, and high blood pressure? Blood pressure and heart disease High blood pressure causes heart disease and increases the risk of stroke. This is more likely to develop in people who have high blood pressure readings or are overweight. Have your blood pressure checked: Every 3-5 years if you are 74-1 years of age. Every year if you are 20 years old or older. Diabetes Have regular diabetes screenings. This checks your fasting blood sugar level. Have the screening done: Once every three years after age 97 if you are at a normal weight and have a low risk for diabetes. More often and at a younger age if you are overweight or have a high risk for diabetes. What should I know about preventing infection? Hepatitis B If you have a higher risk for hepatitis B, you should be screened for this virus. Talk with your health care provider to find out if you are at risk for hepatitis B infection. Hepatitis C Testing is recommended for: Everyone born from [birth year] through 1966. Anyone with known risk factors for hepatitis C. Sexually transmitted infections (STIs) Get screened for STIs, including gonorrhea and chlamydia, if: You are sexually active and are younger than 59 years of age. You are older than 59 years of age and your health care provider tells you that you are at risk for this type of infection. Your sexual activity has changed since you were last screened, and you are at increased risk for chlamydia or gonorrhea. Ask your health care provider if you are at risk. Ask your health care provider about  whether you are at high risk for HIV. Your health care provider may recommend a prescription medicine to help prevent HIV infection. If you choose to take medicine to prevent HIV, you should first get tested for HIV. You should then be tested every 3 months for as long as you are taking the medicine. Pregnancy If you are about to stop  having your period (premenopausal) and you may become pregnant, seek counseling before you get pregnant. Take 400 to 800 micrograms (mcg) of folic acid every day if you become pregnant. Ask for birth control (contraception) if you want to prevent pregnancy. Osteoporosis and menopause Osteoporosis is a disease in which the bones lose minerals and strength with aging. This can result in bone fractures. If you are 5 years old or older, or if you are at risk for osteoporosis and fractures, ask your health care provider if you should: Be screened for bone loss. Take a calcium or vitamin D supplement to lower your risk of fractures. Be given hormone replacement therapy (HRT) to treat symptoms of menopause. Follow these instructions at home: Alcohol use Do not drink alcohol if: Your health care provider tells you not to drink. You are pregnant, may be pregnant, or are planning to become pregnant. If you drink alcohol: Limit how much you have to: 0-1 drink a day. Know how much alcohol is in your drink. In the [place] one drink equals one 12 oz bottle of beer (355 mL), one 5 oz glass of wine (148 mL), or one 1 oz glass of hard liquor (44 mL). Lifestyle Do not use any products that contain nicotine or tobacco. These products include cigarettes, chewing tobacco, and vaping devices, such as e-cigarettes. If you need help quitting, ask your health care provider. Do not use street drugs. Do not share needles. Ask your health care provider for help if you need support or information about quitting drugs. General instructions Schedule regular health, dental, and eye exams. Stay current with your vaccines. Tell your health care provider if: You often feel depressed. You have ever been abused or do not feel safe at home. Summary Adopting a healthy lifestyle and getting preventive care are important in promoting health and wellness. Follow your health care provider's instructions about healthy diet,  exercising, and getting tested or screened for diseases. Follow your health care provider's instructions on monitoring your cholesterol and blood pressure. This information is not intended to replace advice given to you by your health care provider. Make sure you discuss any questions you have with your health care provider. Document Revised: 06/29/2020 Document Reviewed: 06/29/2020 Elsevier Patient Education  2024 [employer].

## 2023-04-18 NOTE — Progress Notes (Signed)
 Complete physical exam  Patient: Joan Campbell   DOB: 1965-07-12   58 y.o. Female  MRN: [id number]  Subjective:    Chief Complaint  Patient presents with   Annual Exam    Joan Campbell is a 58 y.o. female who presents today for a complete physical exam. She reports consuming a general diet. Home exercise routine includes walking 3 hrs per week and strength training three times a week. She generally feels well. She reports sleeping pt has dx of OSA, sees [facility], sometimes has difficulty wearing her CPAP at night. She does not have additional problems to discuss today.    Most recent fall risk assessment:    01/04/2022    4:05 PM  Fall Risk   Falls in the past year? 0  Number falls in past yr: 0  Injury with Fall? 0  Risk for fall due to : No Fall Risks  Follow up Falls evaluation completed     Most recent depression screenings:    03/09/2023    1:03 PM 01/04/2022    4:05 PM  PHQ 2/9 Scores  PHQ - 2 Score 0 0    Vision:Within last year and Dental: No current dental problems and Receives regular dental care  Patient Active Problem List   Diagnosis Date Noted   OSA (obstructive sleep apnea) 04/18/2023   Allergic rhinitis 03/09/2023      Patient Care Team: Karie Georges, MD as PCP - General (Family Medicine)   Outpatient Medications Prior to Visit  Medication Sig   albuterol (VENTOLIN HFA) 108 (90 Base) MCG/ACT inhaler Inhale 2 puffs into the lungs every 6 (six) hours as needed for wheezing or shortness of breath.   fexofenadine (ALLEGRA) 180 MG tablet Take 180 mg by mouth daily.   fluticasone (FLONASE) 50 MCG/ACT nasal spray Place 1 spray into both nostrils in the morning and at bedtime.   Multiple Vitamin (MULTIVITAMIN) tablet Take 1 tablet by mouth daily.   [DISCONTINUED] promethazine-dextromethorphan (PROMETHAZINE-DM) 6.25-15 MG/5ML syrup Take 5 mLs by mouth 2 (two) times daily as needed for cough.   No facility-administered medications prior  to visit.    Review of Systems  HENT:  Negative for hearing loss.   Eyes:  Negative for blurred vision.  Respiratory:  Negative for shortness of breath.   Cardiovascular:  Negative for chest pain.  Gastrointestinal: Negative.   Genitourinary: Negative.   Musculoskeletal:  Negative for back pain.  Neurological:  Negative for headaches.  Psychiatric/Behavioral:  Negative for depression.   All other systems reviewed and are negative.      Objective:     BP 112/80   Pulse 80   Temp 98.3 F (36.8 C) (Oral)   Ht 5\' 5"  (1.651 m)   Wt 205 lb 14.4 oz (93.4 kg)   SpO2 98%   BMI 34.26 kg/m    Physical Exam Vitals reviewed.  Constitutional:      Appearance: Normal appearance. She is well-groomed. She is obese.  HENT:     Right Ear: Tympanic membrane and ear canal normal.     Left Ear: Tympanic membrane and ear canal normal.     Mouth/Throat:     Mouth: Mucous membranes are moist.     Pharynx: No posterior oropharyngeal erythema.  Eyes:     Conjunctiva/sclera: Conjunctivae normal.  Neck:     Thyroid: No thyromegaly.  Cardiovascular:     Rate and Rhythm: Normal rate and regular rhythm.  Pulses: Normal pulses.     Heart sounds: S1 normal and S2 normal.  Pulmonary:     Effort: Pulmonary effort is normal.     Breath sounds: Normal breath sounds and air entry.  Abdominal:     General: Abdomen is flat. Bowel sounds are normal.     Palpations: Abdomen is soft.  Musculoskeletal:     Right lower leg: No edema.     Left lower leg: No edema.  Lymphadenopathy:     Cervical: No cervical adenopathy.  Neurological:     Mental Status: She is alert and oriented to person, place, and time. Mental status is at baseline.     Gait: Gait is intact.  Psychiatric:        Mood and Affect: Mood and affect normal.        Speech: Speech normal.        Behavior: Behavior normal.        Judgment: Judgment normal.      No results found for any visits on 04/18/23.     Assessment &  Plan:    Routine Health Maintenance and Physical Exam  Immunization History  Administered Date(s) Administered   PFIZER(Purple Top)SARS-COV-2 Vaccination 04/28/2019, 05/29/2019, 02/13/2020   Tdap 06/04/2008    Health Maintenance  Topic Date Due   Zoster Vaccines- Shingrix (1 of 2) Never done   DTaP/Tdap/Td (2 - Td or Tdap) 06/05/2018   COVID-19 Vaccine (4 - 2024-25 season) 10/23/2022   MAMMOGRAM  11/28/2022   INFLUENZA VACCINE  05/22/2023 (Originally 09/22/2022)   Cervical Cancer Screening (HPV/Pap Cotest)  06/18/2025   Colonoscopy  10/31/2030   Hepatitis C Screening  Completed   HIV Screening  Completed   HPV VACCINES  Aged Out    Discussed health benefits of physical activity, and encouraged her to engage in regular exercise appropriate for her age and condition.  Lipid screening -     Lipid panel; Future  Hyperglycemia -     Hemoglobin A1c; Future  Routine general medical examination at a health care facility -     Comprehensive metabolic panel; Future -     CBC with Differential/Platelet; Future   Normal physical exam findings today, counseled patient on bowel health including probiotics and recommended fiber intake for adults. Also counseled patient on healthy sleep habits and using melatonin at night. Handouts given on healthy eating and exercise. She is UTD on her health maintenance  Return in 1 year (on 04/17/2024) for annual physical exam.     Karie Georges, MD

## 2023-04-27 ENCOUNTER — Ambulatory Visit
Admission: RE | Admit: 2023-04-27 | Discharge: 2023-04-27 | Disposition: A | Discharge status: Home / Self Care | Payer: BC Managed Care – PPO | Source: Ambulatory Visit | Attending: Family Medicine | Admitting: Family Medicine

## 2023-04-27 DIAGNOSIS — Z1231 Encounter for screening mammogram for malignant neoplasm of breast: Secondary | ICD-10-CM

## 2023-05-04 ENCOUNTER — Encounter: Payer: Self-pay | Admitting: Family Medicine

## 2023-09-27 ENCOUNTER — Telehealth: Payer: Self-pay | Admitting: *Deleted

## 2023-09-27 NOTE — Telephone Encounter (Signed)
 Spoke with the patient and informed her a visit is needed for evaluation prior to placing a referral to a specialist.  Offered to schedule an appt with another provider as PCP does not have any openings soon and the patient stated she will check into this and call back.

## 2023-09-27 NOTE — Telephone Encounter (Signed)
 Copied from CRM #[id number]. Topic: Referral - Request for Referral >> Sep 27, 2023  2:03 PM Chiquita SQUIBB wrote: Did the patient discuss referral with their provider in the last year? No (If No - schedule appointment) (If Yes - send message)  Appointment offered? Yes- Patient denied scheduling at this time, stated it was depending on the cost of the appointment  Type of order/referral and detailed reason for visit: Split ear on the ear lobe due to earrings. Patient would like the split stitched    Preference of office, provider, location: No  If referral order, have you been seen by this specialty before? No (If Yes, this issue or another issue? When? Where?  Can we respond through MyChart? No

## 2024-04-18 ENCOUNTER — Encounter: Payer: BC Managed Care – PPO | Admitting: Family Medicine
